# Patient Record
Sex: Female | Born: 1945 | Race: White | Hispanic: No
Health system: Southern US, Community
[De-identification: ages and names within clinical notes are randomized; demographics above are authoritative.]

---

## 2002-03-28 ENCOUNTER — Other Ambulatory Visit: Admission: RE | Admit: 2002-03-28 | Discharge: 2002-03-28 | Payer: Self-pay

## 2002-04-12 ENCOUNTER — Encounter: Admission: RE | Admit: 2002-04-12 | Discharge: 2002-04-12 | Payer: Self-pay

## 2004-07-17 ENCOUNTER — Encounter: Admission: RE | Admit: 2004-07-17 | Discharge: 2004-07-17 | Payer: Self-pay | Admitting: Obstetrics and Gynecology

## 2004-07-17 ENCOUNTER — Other Ambulatory Visit: Admission: RE | Admit: 2004-07-17 | Discharge: 2004-07-17 | Payer: Self-pay | Admitting: Obstetrics and Gynecology

## 2005-06-18 ENCOUNTER — Encounter: Admission: RE | Admit: 2005-06-18 | Discharge: 2005-06-18 | Payer: Self-pay | Admitting: Family Medicine

## 2007-07-03 ENCOUNTER — Other Ambulatory Visit: Admission: RE | Admit: 2007-07-03 | Discharge: 2007-07-03 | Payer: Self-pay | Admitting: Family Medicine

## 2009-05-26 ENCOUNTER — Encounter: Admission: RE | Admit: 2009-05-26 | Discharge: 2009-05-26 | Payer: Self-pay | Admitting: Obstetrics and Gynecology

## 2009-05-26 ENCOUNTER — Other Ambulatory Visit: Admission: RE | Admit: 2009-05-26 | Discharge: 2009-05-26 | Payer: Self-pay | Admitting: Obstetrics and Gynecology

## 2010-06-02 ENCOUNTER — Encounter: Admission: RE | Admit: 2010-06-02 | Discharge: 2010-06-02 | Payer: Self-pay | Admitting: Obstetrics and Gynecology

## 2010-06-03 ENCOUNTER — Other Ambulatory Visit: Admission: RE | Admit: 2010-06-03 | Discharge: 2010-06-03 | Payer: Self-pay | Admitting: Obstetrics and Gynecology

## 2011-05-17 ENCOUNTER — Other Ambulatory Visit: Payer: Self-pay | Admitting: Obstetrics and Gynecology

## 2011-05-17 DIAGNOSIS — Z1231 Encounter for screening mammogram for malignant neoplasm of breast: Secondary | ICD-10-CM

## 2011-06-02 ENCOUNTER — Other Ambulatory Visit: Payer: Self-pay | Admitting: Obstetrics and Gynecology

## 2011-06-02 ENCOUNTER — Other Ambulatory Visit (HOSPITAL_COMMUNITY)
Admission: RE | Admit: 2011-06-02 | Discharge: 2011-06-02 | Disposition: A | Discharge status: Home / Self Care | Payer: BC Managed Care – PPO | Source: Ambulatory Visit | Attending: Obstetrics and Gynecology | Admitting: Obstetrics and Gynecology

## 2011-06-02 DIAGNOSIS — Z01419 Encounter for gynecological examination (general) (routine) without abnormal findings: Secondary | ICD-10-CM | POA: Insufficient documentation

## 2011-06-04 ENCOUNTER — Ambulatory Visit
Admission: RE | Admit: 2011-06-04 | Discharge: 2011-06-04 | Disposition: A | Discharge status: Home / Self Care | Payer: BC Managed Care – PPO | Source: Ambulatory Visit | Attending: Obstetrics and Gynecology | Admitting: Obstetrics and Gynecology

## 2011-06-04 DIAGNOSIS — Z1231 Encounter for screening mammogram for malignant neoplasm of breast: Secondary | ICD-10-CM

## 2012-05-24 ENCOUNTER — Other Ambulatory Visit: Payer: Self-pay | Admitting: Obstetrics and Gynecology

## 2012-05-24 DIAGNOSIS — Z1231 Encounter for screening mammogram for malignant neoplasm of breast: Secondary | ICD-10-CM

## 2012-06-01 ENCOUNTER — Other Ambulatory Visit (HOSPITAL_COMMUNITY)
Admission: RE | Admit: 2012-06-01 | Discharge: 2012-06-01 | Disposition: A | Discharge status: Home / Self Care | Payer: Medicare Other | Source: Ambulatory Visit | Attending: Obstetrics and Gynecology | Admitting: Obstetrics and Gynecology

## 2012-06-01 ENCOUNTER — Other Ambulatory Visit: Payer: Self-pay | Admitting: Obstetrics and Gynecology

## 2012-06-01 DIAGNOSIS — Z124 Encounter for screening for malignant neoplasm of cervix: Secondary | ICD-10-CM | POA: Diagnosis not present

## 2012-06-01 DIAGNOSIS — Z1159 Encounter for screening for other viral diseases: Secondary | ICD-10-CM | POA: Insufficient documentation

## 2012-06-01 DIAGNOSIS — Z01419 Encounter for gynecological examination (general) (routine) without abnormal findings: Secondary | ICD-10-CM | POA: Diagnosis not present

## 2012-06-01 DIAGNOSIS — N952 Postmenopausal atrophic vaginitis: Secondary | ICD-10-CM | POA: Diagnosis not present

## 2012-06-05 ENCOUNTER — Ambulatory Visit
Admission: RE | Admit: 2012-06-05 | Discharge: 2012-06-05 | Disposition: A | Discharge status: Home / Self Care | Payer: Medicare Other | Source: Ambulatory Visit | Attending: Obstetrics and Gynecology | Admitting: Obstetrics and Gynecology

## 2012-06-05 DIAGNOSIS — Z1231 Encounter for screening mammogram for malignant neoplasm of breast: Secondary | ICD-10-CM

## 2012-06-21 DIAGNOSIS — H2510 Age-related nuclear cataract, unspecified eye: Secondary | ICD-10-CM | POA: Diagnosis not present

## 2012-07-26 DIAGNOSIS — R059 Cough, unspecified: Secondary | ICD-10-CM | POA: Diagnosis not present

## 2012-07-26 DIAGNOSIS — I10 Essential (primary) hypertension: Secondary | ICD-10-CM | POA: Diagnosis not present

## 2012-07-26 DIAGNOSIS — R509 Fever, unspecified: Secondary | ICD-10-CM | POA: Diagnosis not present

## 2012-07-26 DIAGNOSIS — J029 Acute pharyngitis, unspecified: Secondary | ICD-10-CM | POA: Diagnosis not present

## 2012-07-26 DIAGNOSIS — R05 Cough: Secondary | ICD-10-CM | POA: Diagnosis not present

## 2012-10-06 DIAGNOSIS — I10 Essential (primary) hypertension: Secondary | ICD-10-CM | POA: Diagnosis not present

## 2012-10-06 DIAGNOSIS — R82998 Other abnormal findings in urine: Secondary | ICD-10-CM | POA: Diagnosis not present

## 2012-10-06 DIAGNOSIS — E785 Hyperlipidemia, unspecified: Secondary | ICD-10-CM | POA: Diagnosis not present

## 2012-10-09 DIAGNOSIS — Z0000 Encounter for general adult medical examination without abnormal findings: Secondary | ICD-10-CM | POA: Diagnosis not present

## 2012-10-09 DIAGNOSIS — E785 Hyperlipidemia, unspecified: Secondary | ICD-10-CM | POA: Diagnosis not present

## 2012-10-09 DIAGNOSIS — M25469 Effusion, unspecified knee: Secondary | ICD-10-CM | POA: Diagnosis not present

## 2012-10-09 DIAGNOSIS — I10 Essential (primary) hypertension: Secondary | ICD-10-CM | POA: Diagnosis not present

## 2012-10-12 DIAGNOSIS — Z1212 Encounter for screening for malignant neoplasm of rectum: Secondary | ICD-10-CM | POA: Diagnosis not present

## 2013-05-30 ENCOUNTER — Other Ambulatory Visit: Payer: Self-pay

## 2013-05-30 DIAGNOSIS — Z1231 Encounter for screening mammogram for malignant neoplasm of breast: Secondary | ICD-10-CM

## 2013-06-04 DIAGNOSIS — N952 Postmenopausal atrophic vaginitis: Secondary | ICD-10-CM | POA: Diagnosis not present

## 2013-06-05 DIAGNOSIS — H2510 Age-related nuclear cataract, unspecified eye: Secondary | ICD-10-CM | POA: Diagnosis not present

## 2013-06-12 DIAGNOSIS — H6980 Other specified disorders of Eustachian tube, unspecified ear: Secondary | ICD-10-CM | POA: Diagnosis not present

## 2013-06-12 DIAGNOSIS — I10 Essential (primary) hypertension: Secondary | ICD-10-CM | POA: Diagnosis not present

## 2013-06-14 ENCOUNTER — Ambulatory Visit
Admission: RE | Admit: 2013-06-14 | Discharge: 2013-06-14 | Disposition: A | Discharge status: Home / Self Care | Payer: Medicare Other | Source: Ambulatory Visit

## 2013-06-14 DIAGNOSIS — Z1231 Encounter for screening mammogram for malignant neoplasm of breast: Secondary | ICD-10-CM | POA: Diagnosis not present

## 2013-07-02 DIAGNOSIS — H6980 Other specified disorders of Eustachian tube, unspecified ear: Secondary | ICD-10-CM | POA: Diagnosis not present

## 2013-07-02 DIAGNOSIS — J342 Deviated nasal septum: Secondary | ICD-10-CM | POA: Diagnosis not present

## 2013-10-01 DIAGNOSIS — H6980 Other specified disorders of Eustachian tube, unspecified ear: Secondary | ICD-10-CM | POA: Diagnosis not present

## 2013-10-11 DIAGNOSIS — I10 Essential (primary) hypertension: Secondary | ICD-10-CM | POA: Diagnosis not present

## 2013-10-11 DIAGNOSIS — R82998 Other abnormal findings in urine: Secondary | ICD-10-CM | POA: Diagnosis not present

## 2013-10-11 DIAGNOSIS — E785 Hyperlipidemia, unspecified: Secondary | ICD-10-CM | POA: Diagnosis not present

## 2013-10-18 DIAGNOSIS — Z6827 Body mass index (BMI) 27.0-27.9, adult: Secondary | ICD-10-CM | POA: Diagnosis not present

## 2013-10-18 DIAGNOSIS — E785 Hyperlipidemia, unspecified: Secondary | ICD-10-CM | POA: Diagnosis not present

## 2013-10-18 DIAGNOSIS — Z0000 Encounter for general adult medical examination without abnormal findings: Secondary | ICD-10-CM | POA: Diagnosis not present

## 2013-10-18 DIAGNOSIS — Z23 Encounter for immunization: Secondary | ICD-10-CM | POA: Diagnosis not present

## 2013-10-18 DIAGNOSIS — R3129 Other microscopic hematuria: Secondary | ICD-10-CM | POA: Diagnosis not present

## 2013-10-18 DIAGNOSIS — I10 Essential (primary) hypertension: Secondary | ICD-10-CM | POA: Diagnosis not present

## 2013-10-18 DIAGNOSIS — Z79899 Other long term (current) drug therapy: Secondary | ICD-10-CM | POA: Diagnosis not present

## 2013-10-19 DIAGNOSIS — Z1212 Encounter for screening for malignant neoplasm of rectum: Secondary | ICD-10-CM | POA: Diagnosis not present

## 2013-10-25 DIAGNOSIS — Q6101 Congenital single renal cyst: Secondary | ICD-10-CM | POA: Diagnosis not present

## 2013-10-25 DIAGNOSIS — N200 Calculus of kidney: Secondary | ICD-10-CM | POA: Diagnosis not present

## 2013-10-25 DIAGNOSIS — R3129 Other microscopic hematuria: Secondary | ICD-10-CM | POA: Diagnosis not present

## 2013-10-30 DIAGNOSIS — N200 Calculus of kidney: Secondary | ICD-10-CM | POA: Diagnosis not present

## 2013-10-30 DIAGNOSIS — N281 Cyst of kidney, acquired: Secondary | ICD-10-CM | POA: Diagnosis not present

## 2013-11-19 DIAGNOSIS — R3129 Other microscopic hematuria: Secondary | ICD-10-CM | POA: Diagnosis not present

## 2013-11-19 DIAGNOSIS — N289 Disorder of kidney and ureter, unspecified: Secondary | ICD-10-CM | POA: Diagnosis not present

## 2014-04-30 ENCOUNTER — Other Ambulatory Visit (HOSPITAL_COMMUNITY): Payer: Medicare Other

## 2014-04-30 ENCOUNTER — Other Ambulatory Visit: Payer: Self-pay | Admitting: Urology

## 2014-04-30 DIAGNOSIS — N2889 Other specified disorders of kidney and ureter: Secondary | ICD-10-CM

## 2014-05-09 ENCOUNTER — Ambulatory Visit (HOSPITAL_COMMUNITY)
Admission: RE | Admit: 2014-05-09 | Discharge: 2014-05-09 | Disposition: A | Discharge status: Home / Self Care | Payer: Medicare Other | Source: Ambulatory Visit | Attending: Urology | Admitting: Urology

## 2014-05-09 DIAGNOSIS — K7689 Other specified diseases of liver: Secondary | ICD-10-CM | POA: Diagnosis not present

## 2014-05-09 DIAGNOSIS — N281 Cyst of kidney, acquired: Secondary | ICD-10-CM | POA: Diagnosis not present

## 2014-05-09 DIAGNOSIS — N289 Disorder of kidney and ureter, unspecified: Secondary | ICD-10-CM | POA: Insufficient documentation

## 2014-05-09 DIAGNOSIS — N2889 Other specified disorders of kidney and ureter: Secondary | ICD-10-CM

## 2014-05-09 LAB — POCT I-STAT CREATININE: Creatinine, Ser: 1 mg/dL (ref 0.50–1.10)

## 2014-05-09 MED ORDER — GADOBENATE DIMEGLUMINE 529 MG/ML IV SOLN
15.0000 mL | Freq: Once | INTRAVENOUS | Status: AC | PRN
Start: 2014-05-09 — End: 2014-05-09
  Administered 2014-05-09: 13 mL via INTRAVENOUS

## 2014-05-13 ENCOUNTER — Other Ambulatory Visit: Payer: Self-pay

## 2014-05-13 DIAGNOSIS — Z1231 Encounter for screening mammogram for malignant neoplasm of breast: Secondary | ICD-10-CM

## 2014-05-15 DIAGNOSIS — N289 Disorder of kidney and ureter, unspecified: Secondary | ICD-10-CM | POA: Diagnosis not present

## 2014-05-15 DIAGNOSIS — R3129 Other microscopic hematuria: Secondary | ICD-10-CM | POA: Diagnosis not present

## 2014-05-15 DIAGNOSIS — N200 Calculus of kidney: Secondary | ICD-10-CM | POA: Diagnosis not present

## 2014-06-05 ENCOUNTER — Other Ambulatory Visit (HOSPITAL_COMMUNITY)
Admission: RE | Admit: 2014-06-05 | Discharge: 2014-06-05 | Disposition: A | Discharge status: Home / Self Care | Payer: Medicare Other | Source: Ambulatory Visit | Attending: Obstetrics and Gynecology | Admitting: Obstetrics and Gynecology

## 2014-06-05 ENCOUNTER — Other Ambulatory Visit: Payer: Self-pay | Admitting: Obstetrics and Gynecology

## 2014-06-05 DIAGNOSIS — Z01419 Encounter for gynecological examination (general) (routine) without abnormal findings: Secondary | ICD-10-CM | POA: Diagnosis not present

## 2014-06-10 LAB — CYTOLOGY - PAP

## 2014-06-17 ENCOUNTER — Ambulatory Visit
Admission: RE | Admit: 2014-06-17 | Discharge: 2014-06-17 | Disposition: A | Discharge status: Home / Self Care | Payer: Medicare Other | Source: Ambulatory Visit

## 2014-06-17 DIAGNOSIS — Z1231 Encounter for screening mammogram for malignant neoplasm of breast: Secondary | ICD-10-CM

## 2014-07-15 DIAGNOSIS — H43399 Other vitreous opacities, unspecified eye: Secondary | ICD-10-CM | POA: Diagnosis not present

## 2014-07-15 DIAGNOSIS — H2510 Age-related nuclear cataract, unspecified eye: Secondary | ICD-10-CM | POA: Diagnosis not present

## 2014-11-04 DIAGNOSIS — Z0000 Encounter for general adult medical examination without abnormal findings: Secondary | ICD-10-CM | POA: Diagnosis not present

## 2014-11-04 DIAGNOSIS — I10 Essential (primary) hypertension: Secondary | ICD-10-CM | POA: Diagnosis not present

## 2014-11-04 DIAGNOSIS — E785 Hyperlipidemia, unspecified: Secondary | ICD-10-CM | POA: Diagnosis not present

## 2014-11-12 DIAGNOSIS — Z1389 Encounter for screening for other disorder: Secondary | ICD-10-CM | POA: Diagnosis not present

## 2014-11-12 DIAGNOSIS — I10 Essential (primary) hypertension: Secondary | ICD-10-CM | POA: Diagnosis not present

## 2014-11-12 DIAGNOSIS — E785 Hyperlipidemia, unspecified: Secondary | ICD-10-CM | POA: Diagnosis not present

## 2014-11-12 DIAGNOSIS — R312 Other microscopic hematuria: Secondary | ICD-10-CM | POA: Diagnosis not present

## 2014-11-12 DIAGNOSIS — Z0000 Encounter for general adult medical examination without abnormal findings: Secondary | ICD-10-CM | POA: Diagnosis not present

## 2014-11-12 DIAGNOSIS — Z6826 Body mass index (BMI) 26.0-26.9, adult: Secondary | ICD-10-CM | POA: Diagnosis not present

## 2014-11-13 DIAGNOSIS — Z1212 Encounter for screening for malignant neoplasm of rectum: Secondary | ICD-10-CM | POA: Diagnosis not present

## 2015-05-12 ENCOUNTER — Other Ambulatory Visit: Payer: Self-pay

## 2015-05-12 DIAGNOSIS — Z1231 Encounter for screening mammogram for malignant neoplasm of breast: Secondary | ICD-10-CM

## 2015-06-20 ENCOUNTER — Ambulatory Visit
Admission: RE | Admit: 2015-06-20 | Discharge: 2015-06-20 | Disposition: A | Discharge status: Home / Self Care | Payer: Medicare Other | Source: Ambulatory Visit

## 2015-06-20 DIAGNOSIS — Z1231 Encounter for screening mammogram for malignant neoplasm of breast: Secondary | ICD-10-CM

## 2015-07-29 DIAGNOSIS — H25813 Combined forms of age-related cataract, bilateral: Secondary | ICD-10-CM | POA: Diagnosis not present

## 2015-11-10 DIAGNOSIS — R8299 Other abnormal findings in urine: Secondary | ICD-10-CM | POA: Diagnosis not present

## 2015-11-10 DIAGNOSIS — I10 Essential (primary) hypertension: Secondary | ICD-10-CM | POA: Diagnosis not present

## 2015-11-10 DIAGNOSIS — E784 Other hyperlipidemia: Secondary | ICD-10-CM | POA: Diagnosis not present

## 2015-11-17 DIAGNOSIS — M19049 Primary osteoarthritis, unspecified hand: Secondary | ICD-10-CM | POA: Diagnosis not present

## 2015-11-17 DIAGNOSIS — Z1389 Encounter for screening for other disorder: Secondary | ICD-10-CM | POA: Diagnosis not present

## 2015-11-17 DIAGNOSIS — R3129 Other microscopic hematuria: Secondary | ICD-10-CM | POA: Diagnosis not present

## 2015-11-17 DIAGNOSIS — Z6827 Body mass index (BMI) 27.0-27.9, adult: Secondary | ICD-10-CM | POA: Diagnosis not present

## 2015-11-17 DIAGNOSIS — Z23 Encounter for immunization: Secondary | ICD-10-CM | POA: Diagnosis not present

## 2015-11-17 DIAGNOSIS — Z0000 Encounter for general adult medical examination without abnormal findings: Secondary | ICD-10-CM | POA: Diagnosis not present

## 2015-11-17 DIAGNOSIS — E784 Other hyperlipidemia: Secondary | ICD-10-CM | POA: Diagnosis not present

## 2015-11-17 DIAGNOSIS — I10 Essential (primary) hypertension: Secondary | ICD-10-CM | POA: Diagnosis not present

## 2015-11-18 DIAGNOSIS — Z1212 Encounter for screening for malignant neoplasm of rectum: Secondary | ICD-10-CM | POA: Diagnosis not present

## 2016-06-14 ENCOUNTER — Other Ambulatory Visit: Payer: Self-pay | Admitting: Internal Medicine

## 2016-06-14 DIAGNOSIS — Z1231 Encounter for screening mammogram for malignant neoplasm of breast: Secondary | ICD-10-CM

## 2016-06-21 ENCOUNTER — Ambulatory Visit
Admission: RE | Admit: 2016-06-21 | Discharge: 2016-06-21 | Disposition: A | Discharge status: Home / Self Care | Payer: Medicare Other | Source: Ambulatory Visit | Attending: Internal Medicine | Admitting: Internal Medicine

## 2016-06-21 DIAGNOSIS — Z1231 Encounter for screening mammogram for malignant neoplasm of breast: Secondary | ICD-10-CM | POA: Diagnosis not present

## 2016-07-01 ENCOUNTER — Other Ambulatory Visit (HOSPITAL_COMMUNITY)
Admission: RE | Admit: 2016-07-01 | Discharge: 2016-07-01 | Disposition: A | Discharge status: Home / Self Care | Payer: Medicare Other | Source: Ambulatory Visit | Attending: Obstetrics and Gynecology | Admitting: Obstetrics and Gynecology

## 2016-07-01 ENCOUNTER — Other Ambulatory Visit: Payer: Self-pay | Admitting: Obstetrics and Gynecology

## 2016-07-01 DIAGNOSIS — M8580 Other specified disorders of bone density and structure, unspecified site: Secondary | ICD-10-CM | POA: Diagnosis not present

## 2016-07-01 DIAGNOSIS — Z01419 Encounter for gynecological examination (general) (routine) without abnormal findings: Secondary | ICD-10-CM | POA: Diagnosis not present

## 2016-07-01 DIAGNOSIS — Z1151 Encounter for screening for human papillomavirus (HPV): Secondary | ICD-10-CM | POA: Diagnosis not present

## 2016-07-02 LAB — CYTOLOGY - PAP

## 2016-07-29 DIAGNOSIS — M8588 Other specified disorders of bone density and structure, other site: Secondary | ICD-10-CM | POA: Diagnosis not present

## 2016-08-03 DIAGNOSIS — H04123 Dry eye syndrome of bilateral lacrimal glands: Secondary | ICD-10-CM | POA: Diagnosis not present

## 2016-08-03 DIAGNOSIS — H02833 Dermatochalasis of right eye, unspecified eyelid: Secondary | ICD-10-CM | POA: Diagnosis not present

## 2016-08-03 DIAGNOSIS — H25813 Combined forms of age-related cataract, bilateral: Secondary | ICD-10-CM | POA: Diagnosis not present

## 2016-08-03 DIAGNOSIS — H527 Unspecified disorder of refraction: Secondary | ICD-10-CM | POA: Diagnosis not present

## 2016-12-17 DIAGNOSIS — I10 Essential (primary) hypertension: Secondary | ICD-10-CM | POA: Diagnosis not present

## 2016-12-17 DIAGNOSIS — E784 Other hyperlipidemia: Secondary | ICD-10-CM | POA: Diagnosis not present

## 2016-12-24 DIAGNOSIS — Z0000 Encounter for general adult medical examination without abnormal findings: Secondary | ICD-10-CM | POA: Diagnosis not present

## 2016-12-24 DIAGNOSIS — Z1389 Encounter for screening for other disorder: Secondary | ICD-10-CM | POA: Diagnosis not present

## 2016-12-24 DIAGNOSIS — Z6828 Body mass index (BMI) 28.0-28.9, adult: Secondary | ICD-10-CM | POA: Diagnosis not present

## 2016-12-24 DIAGNOSIS — I10 Essential (primary) hypertension: Secondary | ICD-10-CM | POA: Diagnosis not present

## 2016-12-24 DIAGNOSIS — R3121 Asymptomatic microscopic hematuria: Secondary | ICD-10-CM | POA: Diagnosis not present

## 2016-12-24 DIAGNOSIS — M19049 Primary osteoarthritis, unspecified hand: Secondary | ICD-10-CM | POA: Diagnosis not present

## 2016-12-24 DIAGNOSIS — E784 Other hyperlipidemia: Secondary | ICD-10-CM | POA: Diagnosis not present

## 2017-01-20 DIAGNOSIS — Z1212 Encounter for screening for malignant neoplasm of rectum: Secondary | ICD-10-CM | POA: Diagnosis not present

## 2017-03-08 DIAGNOSIS — S61012A Laceration without foreign body of left thumb without damage to nail, initial encounter: Secondary | ICD-10-CM | POA: Diagnosis not present

## 2017-03-18 DIAGNOSIS — Z4802 Encounter for removal of sutures: Secondary | ICD-10-CM | POA: Diagnosis not present

## 2017-05-19 ENCOUNTER — Other Ambulatory Visit: Payer: Self-pay | Admitting: Obstetrics and Gynecology

## 2017-05-19 DIAGNOSIS — Z1231 Encounter for screening mammogram for malignant neoplasm of breast: Secondary | ICD-10-CM

## 2017-06-22 ENCOUNTER — Ambulatory Visit
Admission: RE | Admit: 2017-06-22 | Discharge: 2017-06-22 | Disposition: A | Discharge status: Home / Self Care | Payer: Medicare Other | Source: Ambulatory Visit | Attending: Obstetrics and Gynecology | Admitting: Obstetrics and Gynecology

## 2017-06-22 DIAGNOSIS — Z1231 Encounter for screening mammogram for malignant neoplasm of breast: Secondary | ICD-10-CM

## 2017-08-04 DIAGNOSIS — H25813 Combined forms of age-related cataract, bilateral: Secondary | ICD-10-CM | POA: Diagnosis not present

## 2017-08-04 DIAGNOSIS — H02833 Dermatochalasis of right eye, unspecified eyelid: Secondary | ICD-10-CM | POA: Diagnosis not present

## 2017-08-04 DIAGNOSIS — H0289 Other specified disorders of eyelid: Secondary | ICD-10-CM | POA: Diagnosis not present

## 2018-01-23 DIAGNOSIS — R82998 Other abnormal findings in urine: Secondary | ICD-10-CM | POA: Diagnosis not present

## 2018-01-23 DIAGNOSIS — E7849 Other hyperlipidemia: Secondary | ICD-10-CM | POA: Diagnosis not present

## 2018-01-23 DIAGNOSIS — I10 Essential (primary) hypertension: Secondary | ICD-10-CM | POA: Diagnosis not present

## 2018-01-26 DIAGNOSIS — N200 Calculus of kidney: Secondary | ICD-10-CM | POA: Diagnosis not present

## 2018-01-26 DIAGNOSIS — M19049 Primary osteoarthritis, unspecified hand: Secondary | ICD-10-CM | POA: Diagnosis not present

## 2018-01-26 DIAGNOSIS — I10 Essential (primary) hypertension: Secondary | ICD-10-CM | POA: Diagnosis not present

## 2018-01-26 DIAGNOSIS — Z6828 Body mass index (BMI) 28.0-28.9, adult: Secondary | ICD-10-CM | POA: Diagnosis not present

## 2018-01-26 DIAGNOSIS — Z0000 Encounter for general adult medical examination without abnormal findings: Secondary | ICD-10-CM | POA: Diagnosis not present

## 2018-01-26 DIAGNOSIS — Z1389 Encounter for screening for other disorder: Secondary | ICD-10-CM | POA: Diagnosis not present

## 2018-01-26 DIAGNOSIS — E785 Hyperlipidemia, unspecified: Secondary | ICD-10-CM | POA: Diagnosis not present

## 2018-02-02 DIAGNOSIS — Z1212 Encounter for screening for malignant neoplasm of rectum: Secondary | ICD-10-CM | POA: Diagnosis not present

## 2018-05-15 ENCOUNTER — Other Ambulatory Visit: Payer: Self-pay | Admitting: Obstetrics and Gynecology

## 2018-05-15 DIAGNOSIS — Z1231 Encounter for screening mammogram for malignant neoplasm of breast: Secondary | ICD-10-CM

## 2018-06-26 ENCOUNTER — Ambulatory Visit: Payer: Medicare Other

## 2018-07-03 DIAGNOSIS — Z01419 Encounter for gynecological examination (general) (routine) without abnormal findings: Secondary | ICD-10-CM | POA: Diagnosis not present

## 2018-07-03 DIAGNOSIS — N362 Urethral caruncle: Secondary | ICD-10-CM | POA: Diagnosis not present

## 2018-07-03 DIAGNOSIS — M8588 Other specified disorders of bone density and structure, other site: Secondary | ICD-10-CM | POA: Diagnosis not present

## 2018-07-13 ENCOUNTER — Ambulatory Visit
Admission: RE | Admit: 2018-07-13 | Discharge: 2018-07-13 | Disposition: A | Discharge status: Home / Self Care | Payer: Medicare Other | Source: Ambulatory Visit | Attending: Obstetrics and Gynecology | Admitting: Obstetrics and Gynecology

## 2018-07-13 DIAGNOSIS — Z1231 Encounter for screening mammogram for malignant neoplasm of breast: Secondary | ICD-10-CM

## 2018-08-14 DIAGNOSIS — H25813 Combined forms of age-related cataract, bilateral: Secondary | ICD-10-CM | POA: Diagnosis not present

## 2019-01-22 DIAGNOSIS — I10 Essential (primary) hypertension: Secondary | ICD-10-CM | POA: Diagnosis not present

## 2019-01-22 DIAGNOSIS — R82998 Other abnormal findings in urine: Secondary | ICD-10-CM | POA: Diagnosis not present

## 2019-01-22 DIAGNOSIS — E7849 Other hyperlipidemia: Secondary | ICD-10-CM | POA: Diagnosis not present

## 2019-01-29 DIAGNOSIS — Z0000 Encounter for general adult medical examination without abnormal findings: Secondary | ICD-10-CM | POA: Diagnosis not present

## 2019-01-29 DIAGNOSIS — M19049 Primary osteoarthritis, unspecified hand: Secondary | ICD-10-CM | POA: Diagnosis not present

## 2019-01-29 DIAGNOSIS — Z1339 Encounter for screening examination for other mental health and behavioral disorders: Secondary | ICD-10-CM | POA: Diagnosis not present

## 2019-01-29 DIAGNOSIS — E7849 Other hyperlipidemia: Secondary | ICD-10-CM | POA: Diagnosis not present

## 2019-01-29 DIAGNOSIS — I10 Essential (primary) hypertension: Secondary | ICD-10-CM | POA: Diagnosis not present

## 2019-01-29 DIAGNOSIS — Z1331 Encounter for screening for depression: Secondary | ICD-10-CM | POA: Diagnosis not present

## 2019-01-30 DIAGNOSIS — Z1212 Encounter for screening for malignant neoplasm of rectum: Secondary | ICD-10-CM | POA: Diagnosis not present

## 2019-07-26 ENCOUNTER — Other Ambulatory Visit: Payer: Self-pay | Admitting: Obstetrics and Gynecology

## 2019-07-26 DIAGNOSIS — Z1231 Encounter for screening mammogram for malignant neoplasm of breast: Secondary | ICD-10-CM

## 2019-08-31 IMAGING — MG DIGITAL SCREENING BILATERAL MAMMOGRAM WITH CAD
5 series · 5 of 5 positions shown · non-contrast
Comparison: Previous exam(s).

CLINICAL DATA: Screening.

EXAM:
DIGITAL SCREENING BILATERAL MAMMOGRAM WITH CAD

[R CC (1 of 2)]
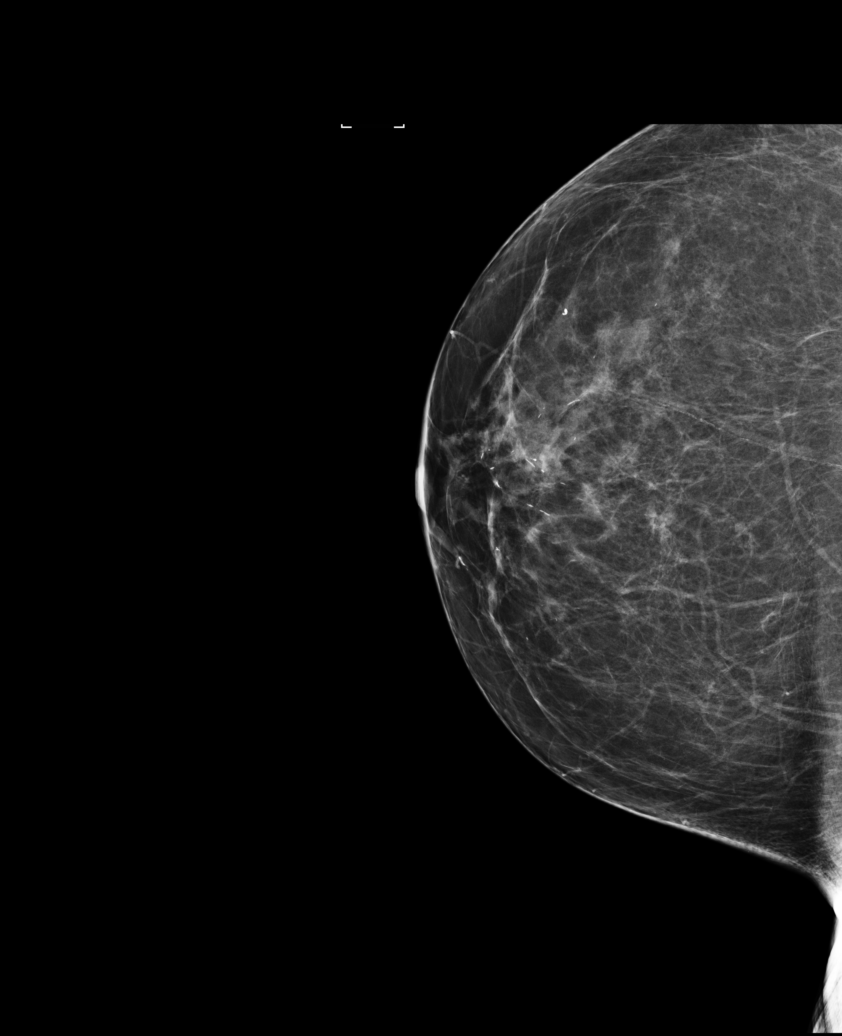

[R MLO]
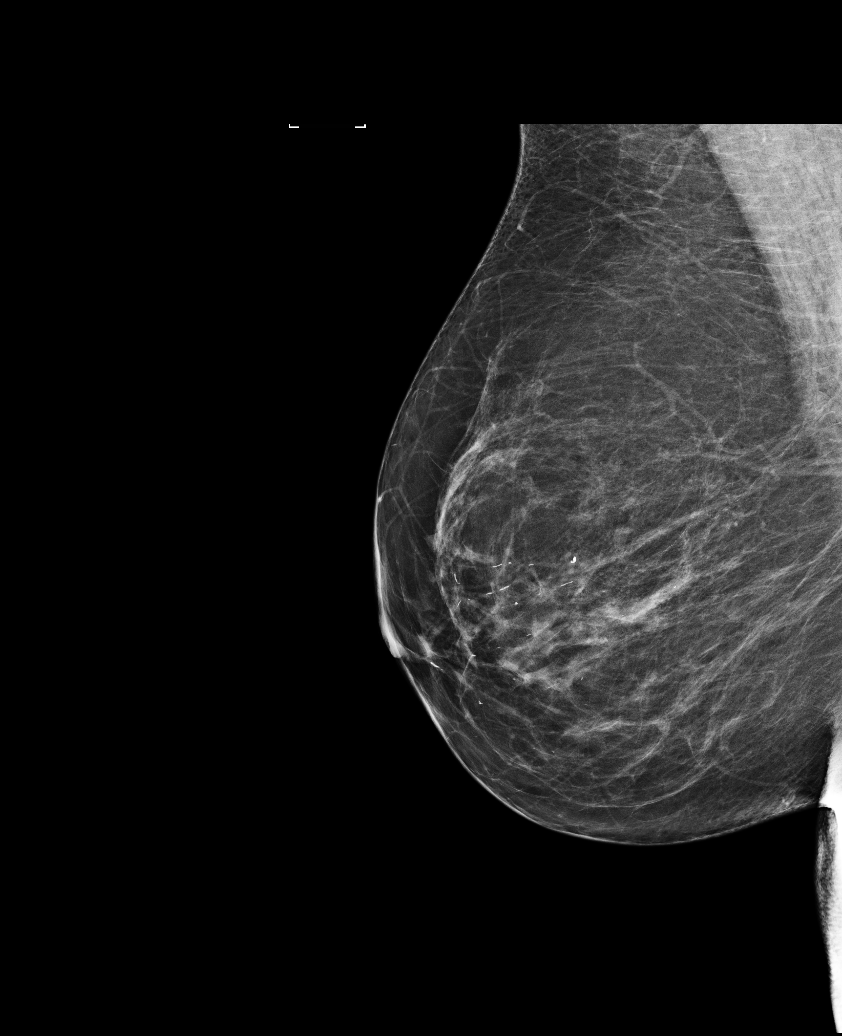

[L MLO]
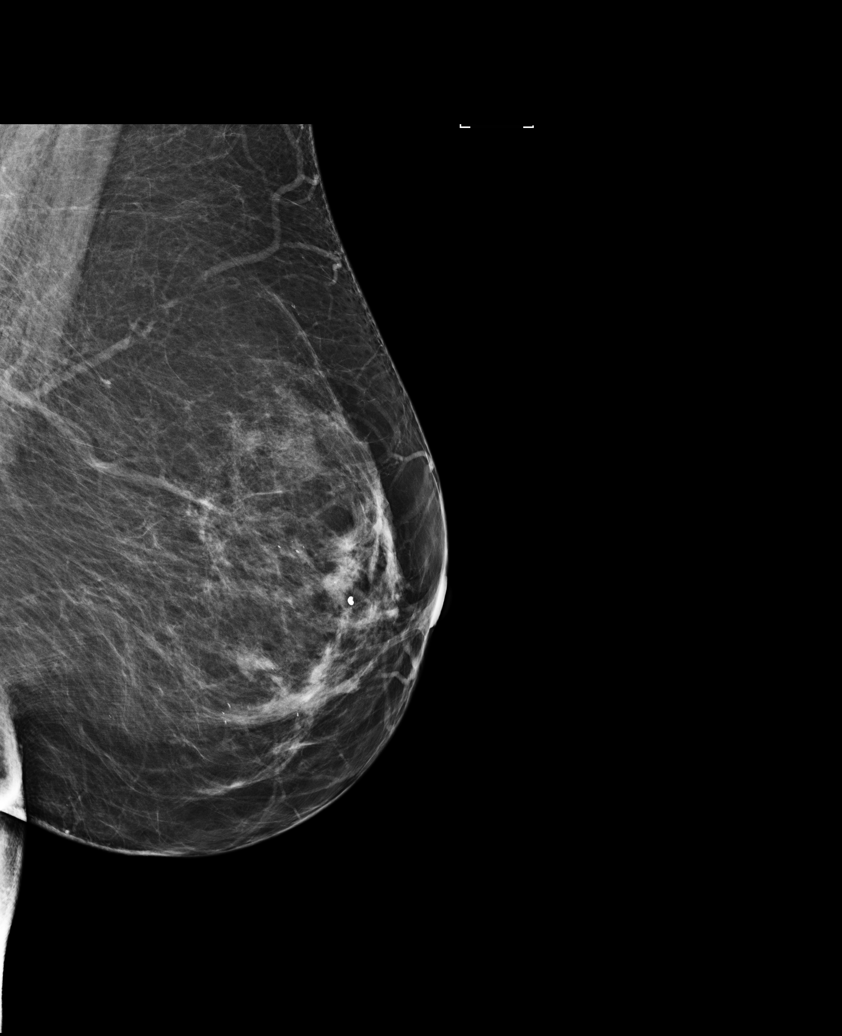

[L CC]
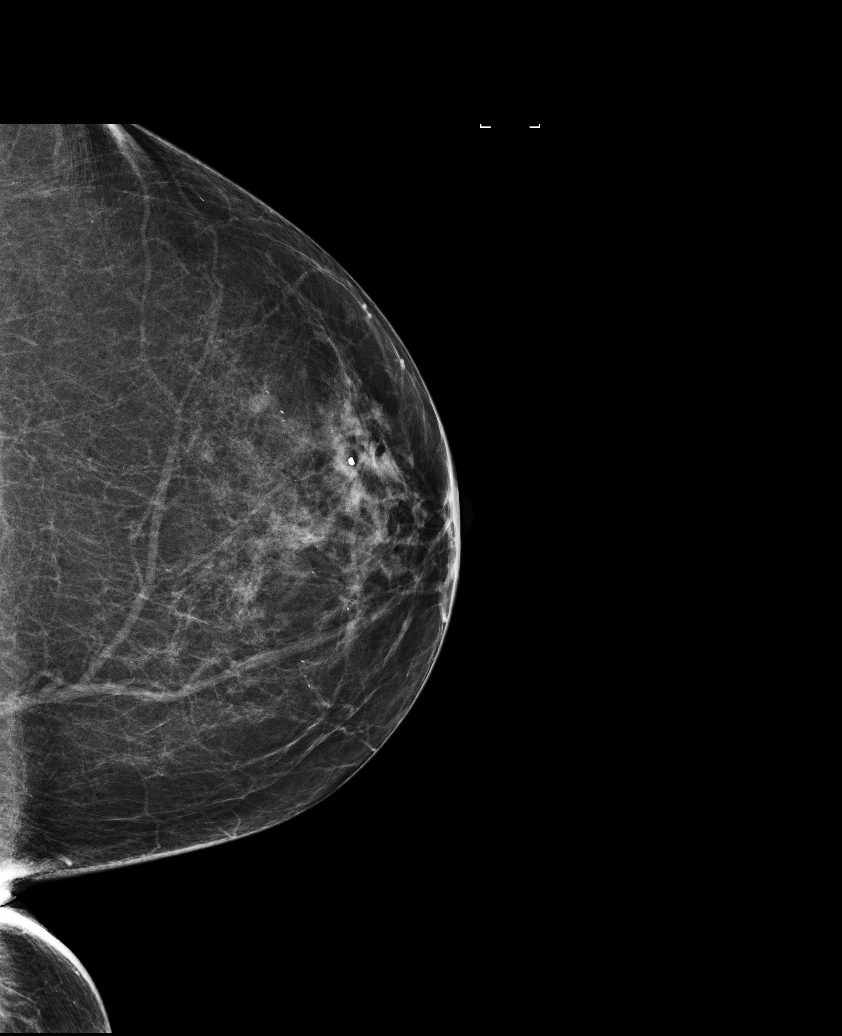

[R CC (2 of 2)]
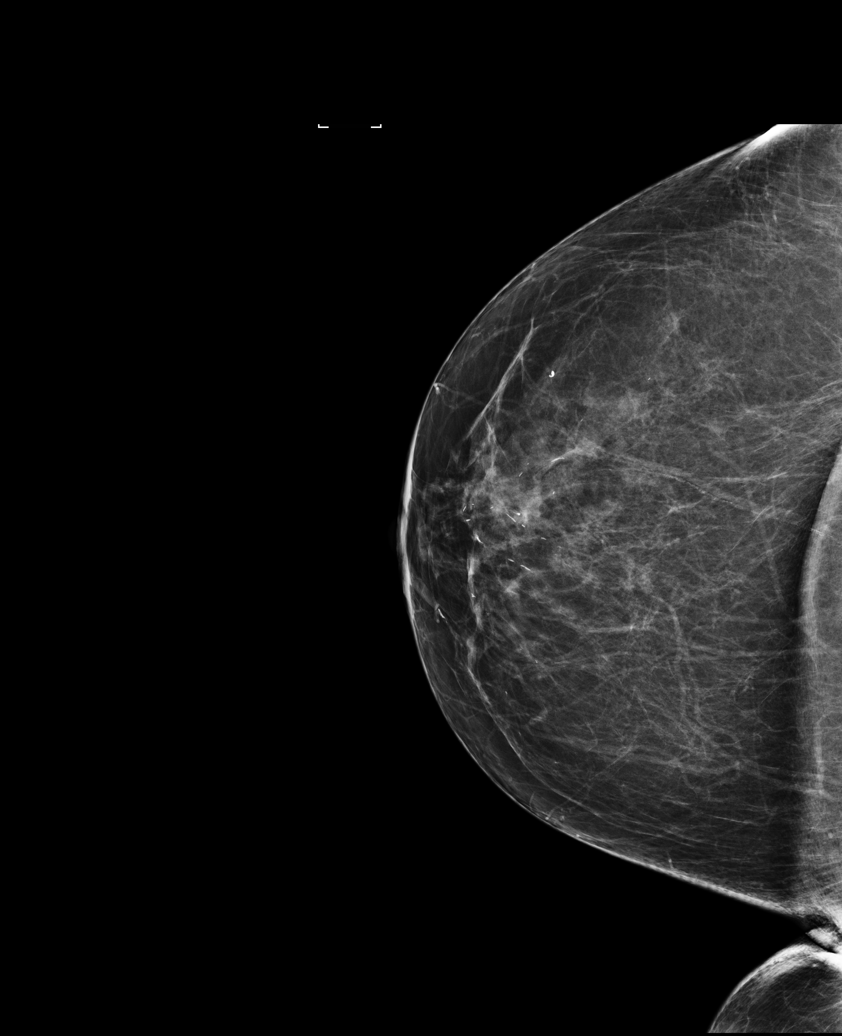

[5 of 5 positions shown; findings below may reference images not displayed]

ACR Breast Density Category b: There are scattered areas of
fibroglandular density.
FINDINGS: There are no findings suspicious for malignancy. Images were
processed with CAD.
IMPRESSION: No mammographic evidence of malignancy. A result letter of this
screening mammogram will be mailed directly to the patient.

RECOMMENDATION:
Screening mammogram in one year. (Code:AS-G-LCT)

BI-RADS CATEGORY  1: Negative.

## 2019-09-07 ENCOUNTER — Ambulatory Visit
Admission: RE | Admit: 2019-09-07 | Discharge: 2019-09-07 | Disposition: A | Discharge status: Home / Self Care | Payer: Medicare HMO | Source: Ambulatory Visit | Attending: Obstetrics and Gynecology | Admitting: Obstetrics and Gynecology

## 2019-09-07 ENCOUNTER — Other Ambulatory Visit: Payer: Self-pay

## 2019-09-07 DIAGNOSIS — Z1231 Encounter for screening mammogram for malignant neoplasm of breast: Secondary | ICD-10-CM | POA: Diagnosis not present

## 2020-01-06 ENCOUNTER — Ambulatory Visit: Payer: Self-pay

## 2020-01-12 ENCOUNTER — Ambulatory Visit: Payer: Medicare HMO

## 2020-01-17 ENCOUNTER — Ambulatory Visit: Payer: Self-pay

## 2020-01-28 DIAGNOSIS — Z0000 Encounter for general adult medical examination without abnormal findings: Secondary | ICD-10-CM | POA: Diagnosis not present

## 2020-01-28 DIAGNOSIS — E7849 Other hyperlipidemia: Secondary | ICD-10-CM | POA: Diagnosis not present

## 2020-01-29 DIAGNOSIS — R69 Illness, unspecified: Secondary | ICD-10-CM | POA: Diagnosis not present

## 2020-02-04 DIAGNOSIS — R82998 Other abnormal findings in urine: Secondary | ICD-10-CM | POA: Diagnosis not present

## 2020-02-04 DIAGNOSIS — I10 Essential (primary) hypertension: Secondary | ICD-10-CM | POA: Diagnosis not present

## 2020-02-04 DIAGNOSIS — N200 Calculus of kidney: Secondary | ICD-10-CM | POA: Diagnosis not present

## 2020-02-04 DIAGNOSIS — E785 Hyperlipidemia, unspecified: Secondary | ICD-10-CM | POA: Diagnosis not present

## 2020-02-04 DIAGNOSIS — R635 Abnormal weight gain: Secondary | ICD-10-CM | POA: Diagnosis not present

## 2020-02-04 DIAGNOSIS — Z1339 Encounter for screening examination for other mental health and behavioral disorders: Secondary | ICD-10-CM | POA: Diagnosis not present

## 2020-02-04 DIAGNOSIS — Z1331 Encounter for screening for depression: Secondary | ICD-10-CM | POA: Diagnosis not present

## 2020-02-04 DIAGNOSIS — Z0000 Encounter for general adult medical examination without abnormal findings: Secondary | ICD-10-CM | POA: Diagnosis not present

## 2020-02-04 DIAGNOSIS — M19049 Primary osteoarthritis, unspecified hand: Secondary | ICD-10-CM | POA: Diagnosis not present

## 2020-02-29 DIAGNOSIS — Z1212 Encounter for screening for malignant neoplasm of rectum: Secondary | ICD-10-CM | POA: Diagnosis not present

## 2020-07-14 DIAGNOSIS — H35363 Drusen (degenerative) of macula, bilateral: Secondary | ICD-10-CM | POA: Diagnosis not present

## 2020-07-14 DIAGNOSIS — H25813 Combined forms of age-related cataract, bilateral: Secondary | ICD-10-CM | POA: Diagnosis not present

## 2020-07-15 DIAGNOSIS — N362 Urethral caruncle: Secondary | ICD-10-CM | POA: Diagnosis not present

## 2020-07-15 DIAGNOSIS — Z01419 Encounter for gynecological examination (general) (routine) without abnormal findings: Secondary | ICD-10-CM | POA: Diagnosis not present

## 2020-07-15 DIAGNOSIS — M8580 Other specified disorders of bone density and structure, unspecified site: Secondary | ICD-10-CM | POA: Diagnosis not present

## 2020-07-18 ENCOUNTER — Other Ambulatory Visit: Payer: Self-pay | Admitting: Obstetrics and Gynecology

## 2020-07-18 DIAGNOSIS — M8580 Other specified disorders of bone density and structure, unspecified site: Secondary | ICD-10-CM

## 2020-08-12 DIAGNOSIS — R69 Illness, unspecified: Secondary | ICD-10-CM | POA: Diagnosis not present

## 2020-08-19 ENCOUNTER — Other Ambulatory Visit: Payer: Self-pay | Admitting: Obstetrics and Gynecology

## 2020-08-19 DIAGNOSIS — Z1231 Encounter for screening mammogram for malignant neoplasm of breast: Secondary | ICD-10-CM

## 2020-08-26 DIAGNOSIS — R69 Illness, unspecified: Secondary | ICD-10-CM | POA: Diagnosis not present

## 2020-09-11 DIAGNOSIS — R69 Illness, unspecified: Secondary | ICD-10-CM | POA: Diagnosis not present

## 2020-09-18 DIAGNOSIS — H353132 Nonexudative age-related macular degeneration, bilateral, intermediate dry stage: Secondary | ICD-10-CM | POA: Diagnosis not present

## 2020-09-18 DIAGNOSIS — H43813 Vitreous degeneration, bilateral: Secondary | ICD-10-CM | POA: Diagnosis not present

## 2020-09-18 DIAGNOSIS — H43823 Vitreomacular adhesion, bilateral: Secondary | ICD-10-CM | POA: Diagnosis not present

## 2020-09-18 DIAGNOSIS — H35033 Hypertensive retinopathy, bilateral: Secondary | ICD-10-CM | POA: Diagnosis not present

## 2020-09-22 DIAGNOSIS — Z0100 Encounter for examination of eyes and vision without abnormal findings: Secondary | ICD-10-CM | POA: Diagnosis not present

## 2020-09-30 ENCOUNTER — Ambulatory Visit
Admission: RE | Admit: 2020-09-30 | Discharge: 2020-09-30 | Disposition: A | Discharge status: Home / Self Care | Payer: Medicare HMO | Source: Ambulatory Visit | Attending: Obstetrics and Gynecology | Admitting: Obstetrics and Gynecology

## 2020-09-30 ENCOUNTER — Other Ambulatory Visit: Payer: Self-pay

## 2020-09-30 ENCOUNTER — Ambulatory Visit: Payer: Medicare HMO

## 2020-09-30 DIAGNOSIS — Z1231 Encounter for screening mammogram for malignant neoplasm of breast: Secondary | ICD-10-CM | POA: Diagnosis not present

## 2020-10-25 IMAGING — MG MM DIGITAL SCREENING BILAT W/ CAD
4 series · 4 of 4 positions shown · non-contrast
Comparison: Previous exam(s).

CLINICAL DATA: Screening.

EXAM:
DIGITAL SCREENING BILATERAL MAMMOGRAM WITH CAD

[L CC]
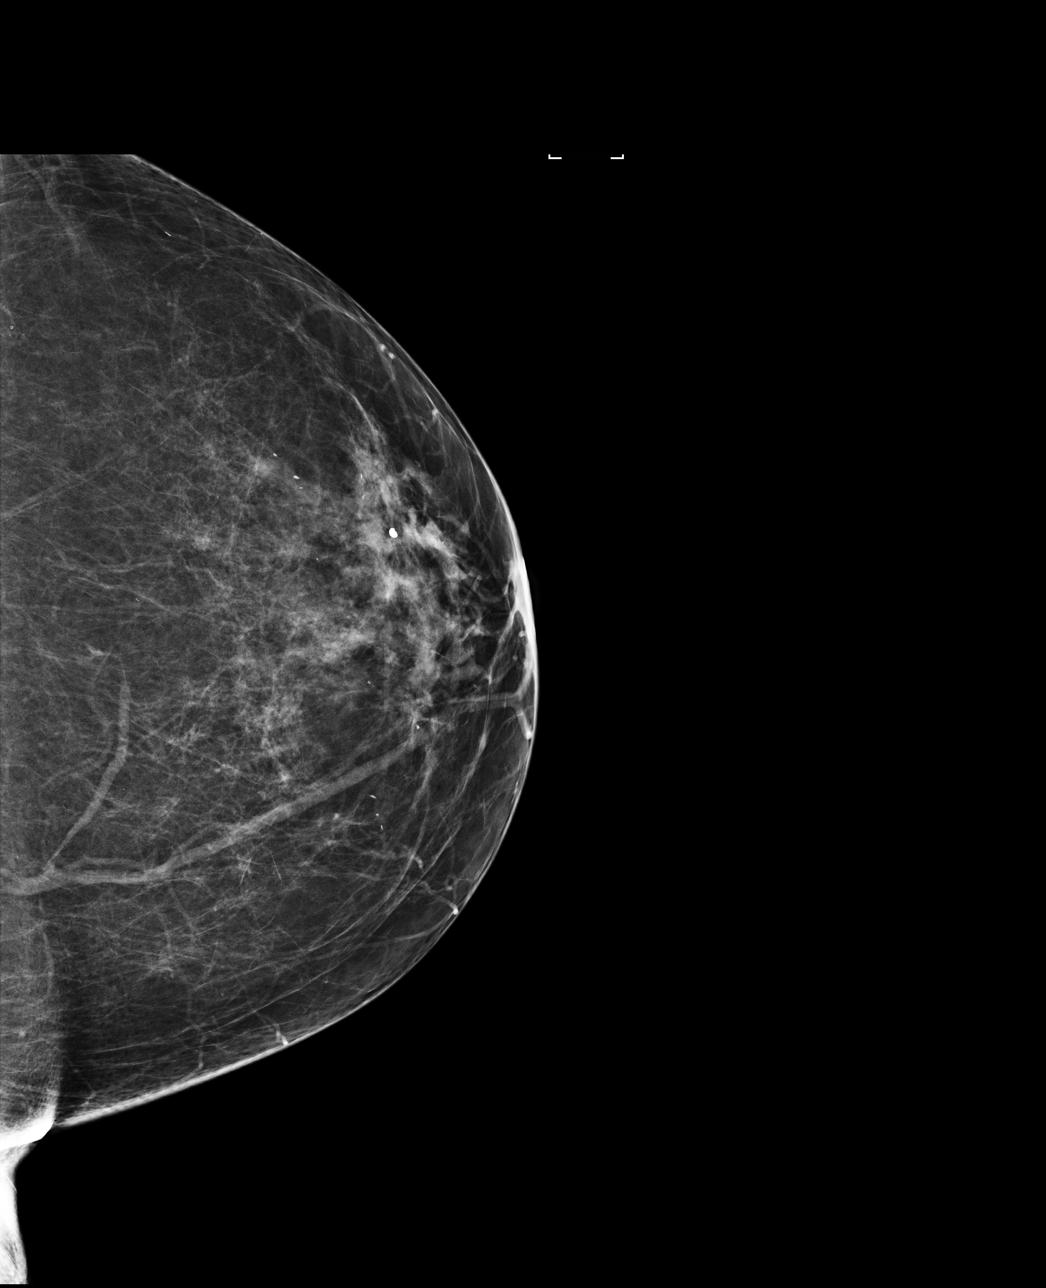

[L MLO]
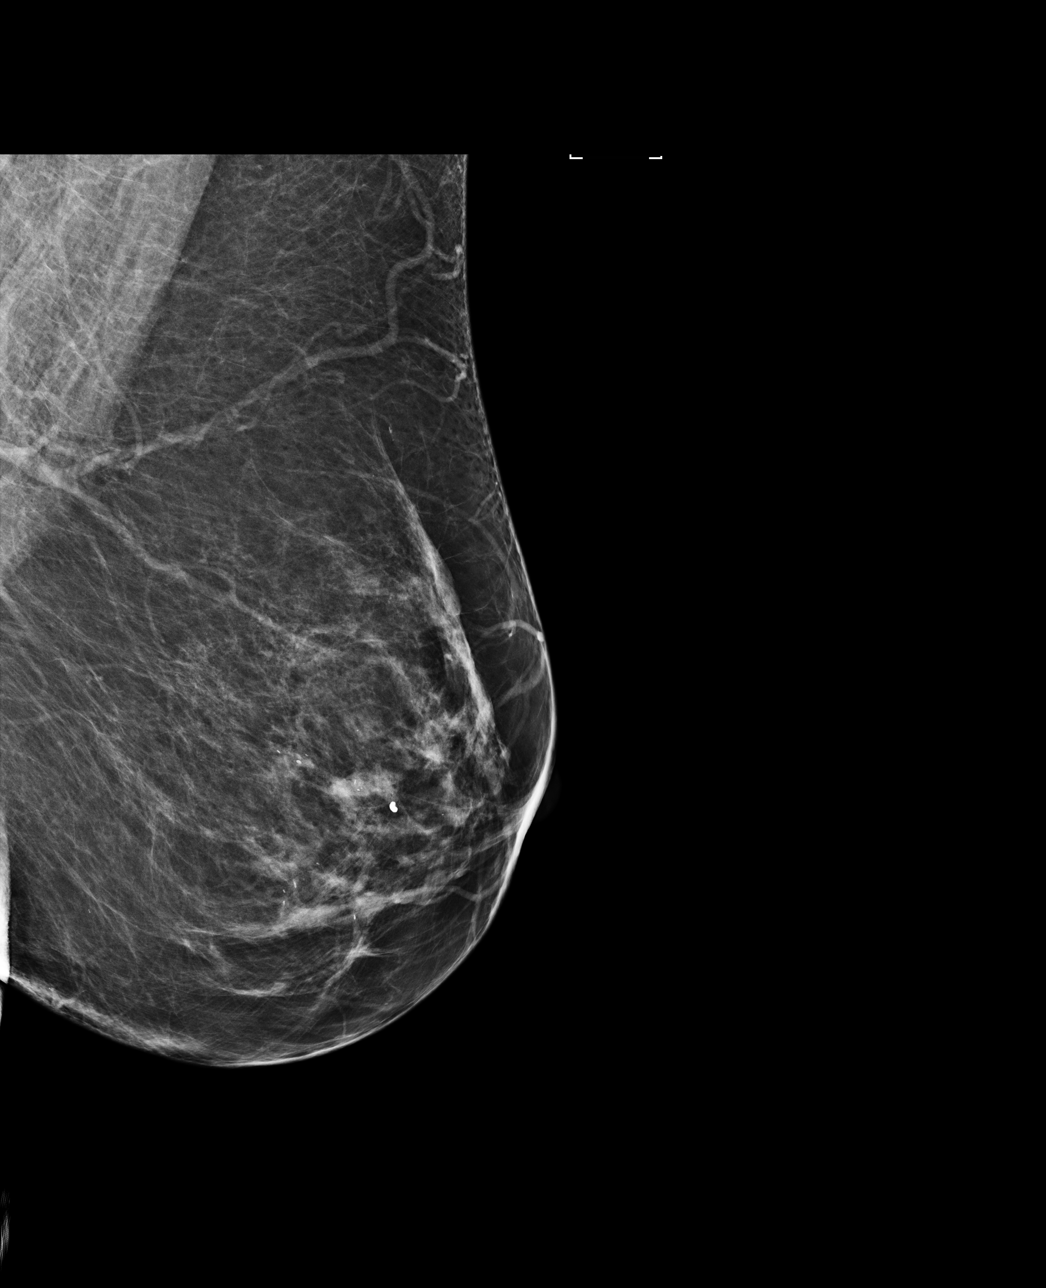

[R CC]
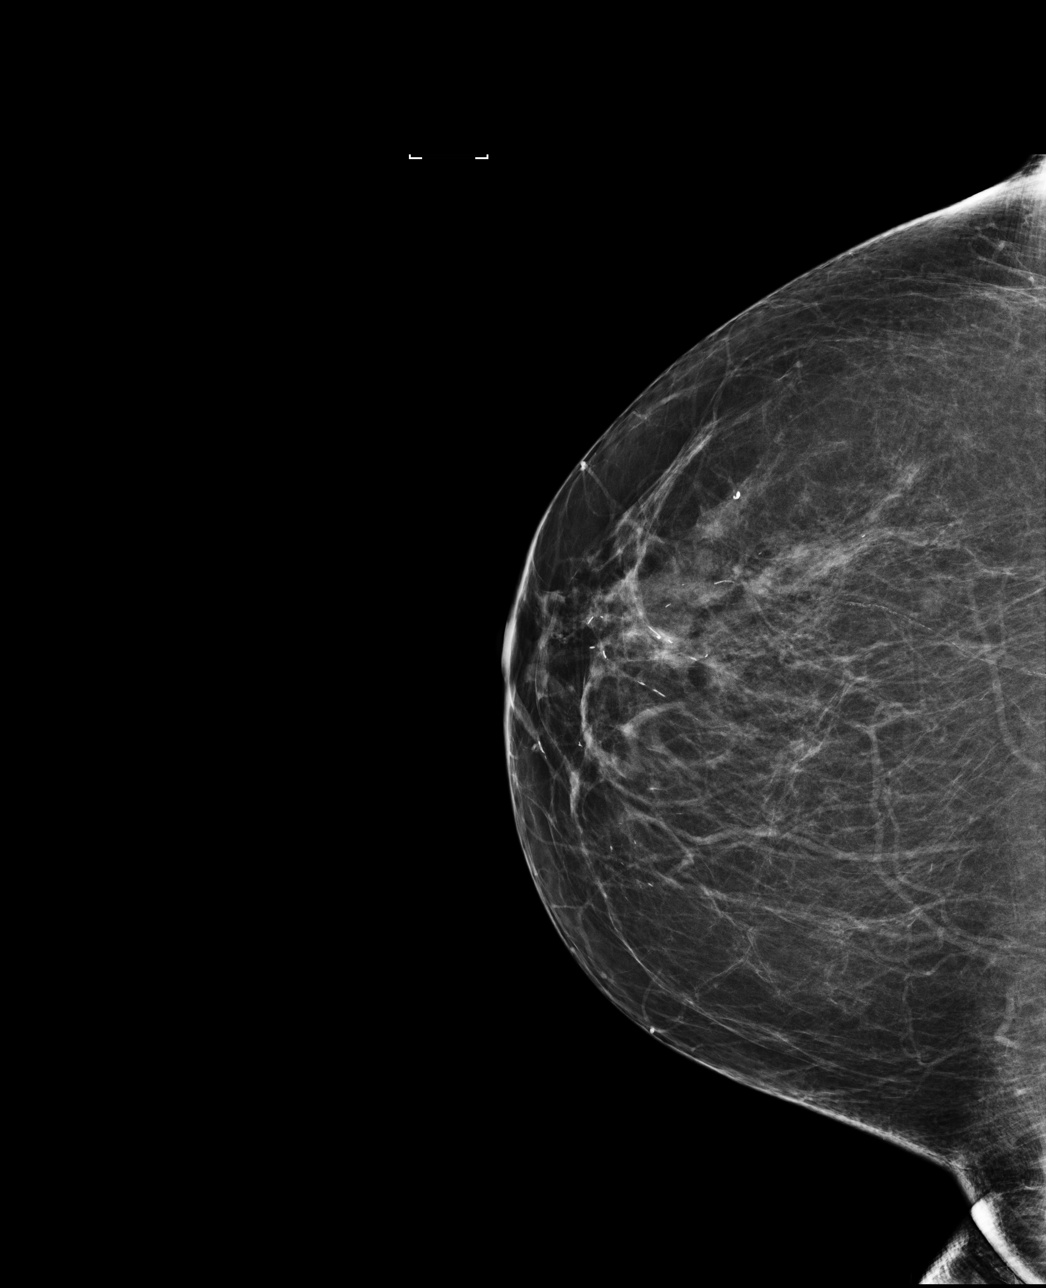

[R MLO]
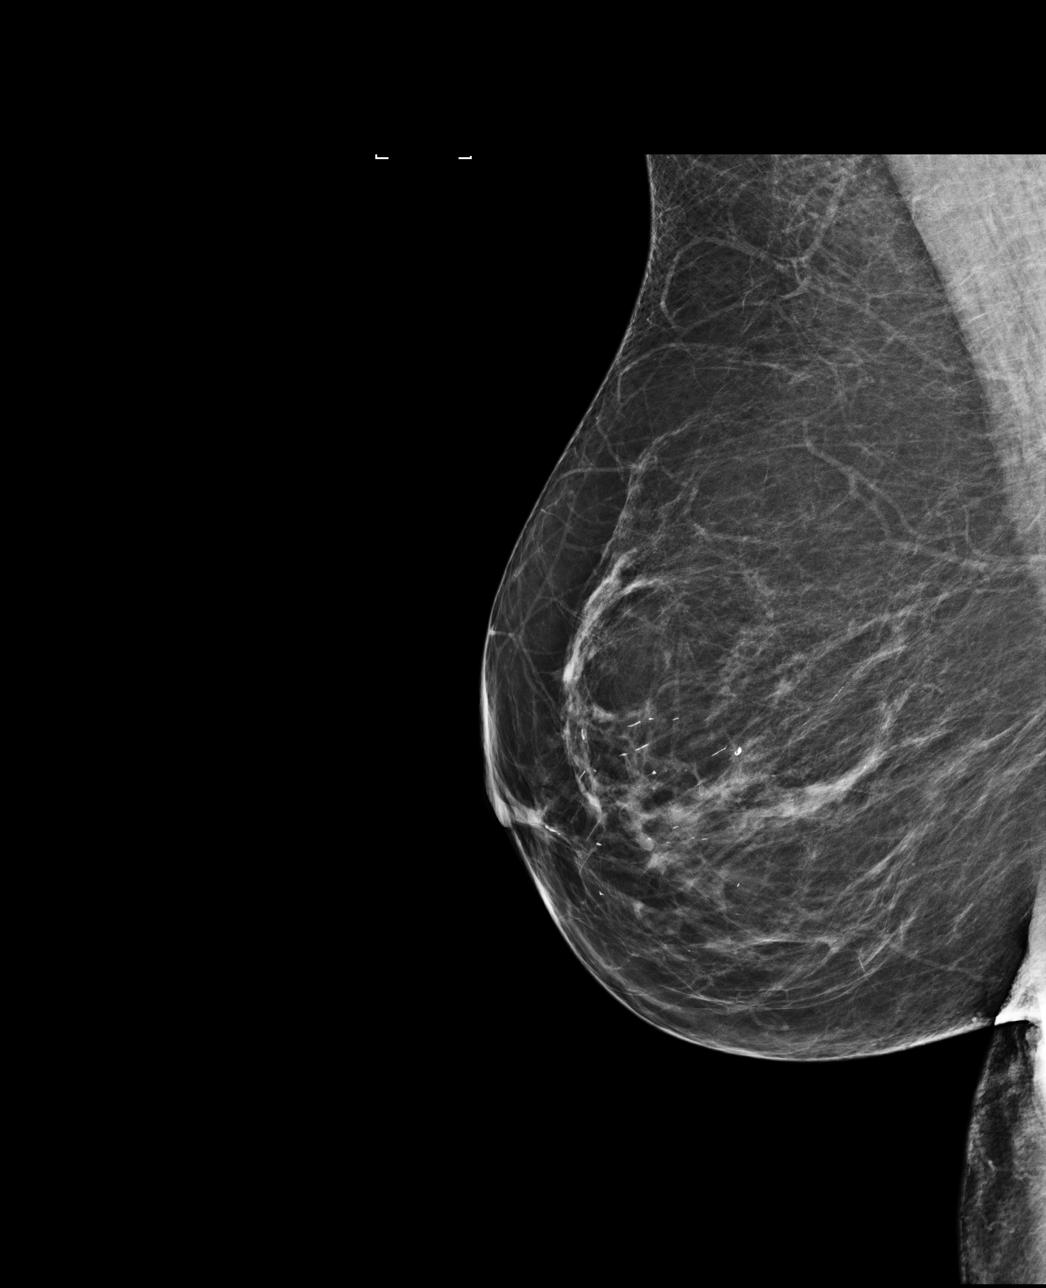

[4 of 4 positions shown; findings below may reference images not displayed]

ACR Breast Density Category b: There are scattered areas of
fibroglandular density.
FINDINGS: There are no findings suspicious for malignancy. Images were
processed with CAD.
IMPRESSION: No mammographic evidence of malignancy. A result letter of this
screening mammogram will be mailed directly to the patient.

RECOMMENDATION:
Screening mammogram in one year. (Code:AS-G-LCT)

BI-RADS CATEGORY  1: Negative.

## 2020-12-10 ENCOUNTER — Other Ambulatory Visit: Payer: Self-pay

## 2020-12-10 ENCOUNTER — Ambulatory Visit
Admission: RE | Admit: 2020-12-10 | Discharge: 2020-12-10 | Disposition: A | Discharge status: Home / Self Care | Payer: Medicare HMO | Source: Ambulatory Visit | Attending: Obstetrics and Gynecology | Admitting: Obstetrics and Gynecology

## 2020-12-10 DIAGNOSIS — Z780 Asymptomatic menopausal state: Secondary | ICD-10-CM | POA: Diagnosis not present

## 2020-12-10 DIAGNOSIS — M8589 Other specified disorders of bone density and structure, multiple sites: Secondary | ICD-10-CM | POA: Diagnosis not present

## 2020-12-10 DIAGNOSIS — M8580 Other specified disorders of bone density and structure, unspecified site: Secondary | ICD-10-CM

## 2020-12-19 DIAGNOSIS — H2513 Age-related nuclear cataract, bilateral: Secondary | ICD-10-CM | POA: Diagnosis not present

## 2020-12-19 DIAGNOSIS — H25013 Cortical age-related cataract, bilateral: Secondary | ICD-10-CM | POA: Diagnosis not present

## 2020-12-19 DIAGNOSIS — H524 Presbyopia: Secondary | ICD-10-CM | POA: Diagnosis not present

## 2020-12-19 DIAGNOSIS — H35363 Drusen (degenerative) of macula, bilateral: Secondary | ICD-10-CM | POA: Diagnosis not present

## 2021-02-24 DIAGNOSIS — E785 Hyperlipidemia, unspecified: Secondary | ICD-10-CM | POA: Diagnosis not present

## 2021-03-03 DIAGNOSIS — M791 Myalgia, unspecified site: Secondary | ICD-10-CM | POA: Diagnosis not present

## 2021-03-03 DIAGNOSIS — Z1331 Encounter for screening for depression: Secondary | ICD-10-CM | POA: Diagnosis not present

## 2021-03-03 DIAGNOSIS — Z1339 Encounter for screening examination for other mental health and behavioral disorders: Secondary | ICD-10-CM | POA: Diagnosis not present

## 2021-03-03 DIAGNOSIS — Z0000 Encounter for general adult medical examination without abnormal findings: Secondary | ICD-10-CM | POA: Diagnosis not present

## 2021-03-03 DIAGNOSIS — E785 Hyperlipidemia, unspecified: Secondary | ICD-10-CM | POA: Diagnosis not present

## 2021-03-03 DIAGNOSIS — T466X5A Adverse effect of antihyperlipidemic and antiarteriosclerotic drugs, initial encounter: Secondary | ICD-10-CM | POA: Diagnosis not present

## 2021-03-03 DIAGNOSIS — M19049 Primary osteoarthritis, unspecified hand: Secondary | ICD-10-CM | POA: Diagnosis not present

## 2021-03-03 DIAGNOSIS — R82998 Other abnormal findings in urine: Secondary | ICD-10-CM | POA: Diagnosis not present

## 2021-03-03 DIAGNOSIS — I10 Essential (primary) hypertension: Secondary | ICD-10-CM | POA: Diagnosis not present

## 2021-07-07 DIAGNOSIS — E785 Hyperlipidemia, unspecified: Secondary | ICD-10-CM | POA: Diagnosis not present

## 2021-08-25 ENCOUNTER — Other Ambulatory Visit: Payer: Self-pay | Admitting: Obstetrics and Gynecology

## 2021-08-25 DIAGNOSIS — Z1231 Encounter for screening mammogram for malignant neoplasm of breast: Secondary | ICD-10-CM

## 2021-10-06 ENCOUNTER — Other Ambulatory Visit: Payer: Self-pay

## 2021-10-06 ENCOUNTER — Ambulatory Visit
Admission: RE | Admit: 2021-10-06 | Discharge: 2021-10-06 | Disposition: A | Discharge status: Home / Self Care | Payer: Medicare HMO | Source: Ambulatory Visit | Attending: Obstetrics and Gynecology | Admitting: Obstetrics and Gynecology

## 2021-10-06 DIAGNOSIS — Z1231 Encounter for screening mammogram for malignant neoplasm of breast: Secondary | ICD-10-CM | POA: Diagnosis not present

## 2021-10-15 DIAGNOSIS — K5900 Constipation, unspecified: Secondary | ICD-10-CM | POA: Diagnosis not present

## 2021-10-15 DIAGNOSIS — R1030 Lower abdominal pain, unspecified: Secondary | ICD-10-CM | POA: Diagnosis not present

## 2021-12-24 DIAGNOSIS — H2513 Age-related nuclear cataract, bilateral: Secondary | ICD-10-CM | POA: Diagnosis not present

## 2021-12-24 DIAGNOSIS — H524 Presbyopia: Secondary | ICD-10-CM | POA: Diagnosis not present

## 2021-12-24 DIAGNOSIS — H25013 Cortical age-related cataract, bilateral: Secondary | ICD-10-CM | POA: Diagnosis not present

## 2021-12-24 DIAGNOSIS — H35363 Drusen (degenerative) of macula, bilateral: Secondary | ICD-10-CM | POA: Diagnosis not present

## 2022-01-20 DIAGNOSIS — Z1211 Encounter for screening for malignant neoplasm of colon: Secondary | ICD-10-CM | POA: Diagnosis not present

## 2022-01-20 DIAGNOSIS — K5730 Diverticulosis of large intestine without perforation or abscess without bleeding: Secondary | ICD-10-CM | POA: Diagnosis not present

## 2022-04-13 DIAGNOSIS — I10 Essential (primary) hypertension: Secondary | ICD-10-CM | POA: Diagnosis not present

## 2022-04-13 DIAGNOSIS — E785 Hyperlipidemia, unspecified: Secondary | ICD-10-CM | POA: Diagnosis not present

## 2022-04-13 DIAGNOSIS — R7989 Other specified abnormal findings of blood chemistry: Secondary | ICD-10-CM | POA: Diagnosis not present

## 2022-04-20 DIAGNOSIS — E785 Hyperlipidemia, unspecified: Secondary | ICD-10-CM | POA: Diagnosis not present

## 2022-04-20 DIAGNOSIS — Z0000 Encounter for general adult medical examination without abnormal findings: Secondary | ICD-10-CM | POA: Diagnosis not present

## 2022-04-20 DIAGNOSIS — E7800 Pure hypercholesterolemia, unspecified: Secondary | ICD-10-CM | POA: Diagnosis not present

## 2022-04-20 DIAGNOSIS — R82998 Other abnormal findings in urine: Secondary | ICD-10-CM | POA: Diagnosis not present

## 2022-04-20 DIAGNOSIS — I10 Essential (primary) hypertension: Secondary | ICD-10-CM | POA: Diagnosis not present

## 2022-04-20 DIAGNOSIS — M791 Myalgia, unspecified site: Secondary | ICD-10-CM | POA: Diagnosis not present

## 2022-07-19 DIAGNOSIS — Z01419 Encounter for gynecological examination (general) (routine) without abnormal findings: Secondary | ICD-10-CM | POA: Diagnosis not present

## 2022-08-04 DIAGNOSIS — H25013 Cortical age-related cataract, bilateral: Secondary | ICD-10-CM | POA: Diagnosis not present

## 2022-08-04 DIAGNOSIS — H2513 Age-related nuclear cataract, bilateral: Secondary | ICD-10-CM | POA: Diagnosis not present

## 2022-09-01 ENCOUNTER — Other Ambulatory Visit: Payer: Self-pay | Admitting: Obstetrics and Gynecology

## 2022-09-01 ENCOUNTER — Other Ambulatory Visit: Payer: Self-pay | Admitting: Internal Medicine

## 2022-09-01 DIAGNOSIS — Z1231 Encounter for screening mammogram for malignant neoplasm of breast: Secondary | ICD-10-CM

## 2022-09-28 DIAGNOSIS — H2512 Age-related nuclear cataract, left eye: Secondary | ICD-10-CM | POA: Diagnosis not present

## 2022-09-28 DIAGNOSIS — H25812 Combined forms of age-related cataract, left eye: Secondary | ICD-10-CM | POA: Diagnosis not present

## 2022-09-28 DIAGNOSIS — H269 Unspecified cataract: Secondary | ICD-10-CM | POA: Diagnosis not present

## 2022-09-28 DIAGNOSIS — H25012 Cortical age-related cataract, left eye: Secondary | ICD-10-CM | POA: Diagnosis not present

## 2022-09-28 DIAGNOSIS — Z961 Presence of intraocular lens: Secondary | ICD-10-CM | POA: Diagnosis not present

## 2022-10-07 ENCOUNTER — Ambulatory Visit: Payer: Medicare HMO

## 2022-10-12 DIAGNOSIS — Z961 Presence of intraocular lens: Secondary | ICD-10-CM | POA: Diagnosis not present

## 2022-10-12 DIAGNOSIS — H2511 Age-related nuclear cataract, right eye: Secondary | ICD-10-CM | POA: Diagnosis not present

## 2022-10-12 DIAGNOSIS — H25811 Combined forms of age-related cataract, right eye: Secondary | ICD-10-CM | POA: Diagnosis not present

## 2022-10-12 DIAGNOSIS — H25011 Cortical age-related cataract, right eye: Secondary | ICD-10-CM | POA: Diagnosis not present

## 2022-10-12 DIAGNOSIS — H52201 Unspecified astigmatism, right eye: Secondary | ICD-10-CM | POA: Diagnosis not present

## 2022-10-12 DIAGNOSIS — H269 Unspecified cataract: Secondary | ICD-10-CM | POA: Diagnosis not present

## 2022-11-26 ENCOUNTER — Ambulatory Visit
Admission: RE | Admit: 2022-11-26 | Discharge: 2022-11-26 | Disposition: A | Discharge status: Home / Self Care | Payer: Medicare HMO | Source: Ambulatory Visit | Attending: Internal Medicine | Admitting: Internal Medicine

## 2022-11-26 DIAGNOSIS — Z1231 Encounter for screening mammogram for malignant neoplasm of breast: Secondary | ICD-10-CM

## 2023-06-27 DIAGNOSIS — E785 Hyperlipidemia, unspecified: Secondary | ICD-10-CM | POA: Diagnosis not present

## 2023-06-27 DIAGNOSIS — I10 Essential (primary) hypertension: Secondary | ICD-10-CM | POA: Diagnosis not present

## 2023-06-27 DIAGNOSIS — Z1212 Encounter for screening for malignant neoplasm of rectum: Secondary | ICD-10-CM | POA: Diagnosis not present

## 2023-06-27 DIAGNOSIS — E7800 Pure hypercholesterolemia, unspecified: Secondary | ICD-10-CM | POA: Diagnosis not present

## 2023-07-04 DIAGNOSIS — I10 Essential (primary) hypertension: Secondary | ICD-10-CM | POA: Diagnosis not present

## 2023-07-04 DIAGNOSIS — R82998 Other abnormal findings in urine: Secondary | ICD-10-CM | POA: Diagnosis not present

## 2023-07-04 DIAGNOSIS — Z1331 Encounter for screening for depression: Secondary | ICD-10-CM | POA: Diagnosis not present

## 2023-07-04 DIAGNOSIS — Z1339 Encounter for screening examination for other mental health and behavioral disorders: Secondary | ICD-10-CM | POA: Diagnosis not present

## 2023-07-04 DIAGNOSIS — E785 Hyperlipidemia, unspecified: Secondary | ICD-10-CM | POA: Diagnosis not present

## 2023-07-04 DIAGNOSIS — Z0000 Encounter for general adult medical examination without abnormal findings: Secondary | ICD-10-CM | POA: Diagnosis not present

## 2023-07-04 DIAGNOSIS — M19049 Primary osteoarthritis, unspecified hand: Secondary | ICD-10-CM | POA: Diagnosis not present

## 2023-11-08 ENCOUNTER — Other Ambulatory Visit: Payer: Self-pay | Admitting: Internal Medicine

## 2023-11-08 DIAGNOSIS — Z0000 Encounter for general adult medical examination without abnormal findings: Secondary | ICD-10-CM

## 2023-11-24 DIAGNOSIS — Z961 Presence of intraocular lens: Secondary | ICD-10-CM | POA: Diagnosis not present

## 2023-12-08 ENCOUNTER — Ambulatory Visit
Admission: RE | Admit: 2023-12-08 | Discharge: 2023-12-08 | Disposition: A | Discharge status: Home / Self Care | Payer: Medicare HMO | Source: Ambulatory Visit | Attending: Internal Medicine | Admitting: Internal Medicine

## 2023-12-08 DIAGNOSIS — Z1231 Encounter for screening mammogram for malignant neoplasm of breast: Secondary | ICD-10-CM | POA: Diagnosis not present

## 2023-12-08 DIAGNOSIS — Z0000 Encounter for general adult medical examination without abnormal findings: Secondary | ICD-10-CM

## 2023-12-13 ENCOUNTER — Other Ambulatory Visit: Payer: Self-pay | Admitting: Internal Medicine

## 2023-12-13 DIAGNOSIS — R928 Other abnormal and inconclusive findings on diagnostic imaging of breast: Secondary | ICD-10-CM

## 2023-12-15 ENCOUNTER — Ambulatory Visit: Payer: Medicare HMO

## 2023-12-22 ENCOUNTER — Ambulatory Visit
Admission: RE | Admit: 2023-12-22 | Discharge: 2023-12-22 | Disposition: A | Discharge status: Home / Self Care | Payer: Medicare HMO | Source: Ambulatory Visit | Attending: Internal Medicine | Admitting: Internal Medicine

## 2023-12-22 DIAGNOSIS — R928 Other abnormal and inconclusive findings on diagnostic imaging of breast: Secondary | ICD-10-CM

## 2023-12-22 DIAGNOSIS — N6322 Unspecified lump in the left breast, upper inner quadrant: Secondary | ICD-10-CM | POA: Diagnosis not present

## 2024-05-03 DIAGNOSIS — H903 Sensorineural hearing loss, bilateral: Secondary | ICD-10-CM | POA: Diagnosis not present

## 2024-07-05 DIAGNOSIS — E785 Hyperlipidemia, unspecified: Secondary | ICD-10-CM | POA: Diagnosis not present

## 2024-07-05 DIAGNOSIS — I10 Essential (primary) hypertension: Secondary | ICD-10-CM | POA: Diagnosis not present

## 2024-07-05 DIAGNOSIS — E7800 Pure hypercholesterolemia, unspecified: Secondary | ICD-10-CM | POA: Diagnosis not present

## 2024-07-10 DIAGNOSIS — I10 Essential (primary) hypertension: Secondary | ICD-10-CM | POA: Diagnosis not present

## 2024-07-10 DIAGNOSIS — G720 Drug-induced myopathy: Secondary | ICD-10-CM | POA: Diagnosis not present

## 2024-07-10 DIAGNOSIS — L237 Allergic contact dermatitis due to plants, except food: Secondary | ICD-10-CM | POA: Diagnosis not present

## 2024-07-10 DIAGNOSIS — E785 Hyperlipidemia, unspecified: Secondary | ICD-10-CM | POA: Diagnosis not present

## 2024-07-10 DIAGNOSIS — Z1331 Encounter for screening for depression: Secondary | ICD-10-CM | POA: Diagnosis not present

## 2024-07-10 DIAGNOSIS — R82998 Other abnormal findings in urine: Secondary | ICD-10-CM | POA: Diagnosis not present

## 2024-07-10 DIAGNOSIS — Z1339 Encounter for screening examination for other mental health and behavioral disorders: Secondary | ICD-10-CM | POA: Diagnosis not present

## 2024-07-10 DIAGNOSIS — Z0000 Encounter for general adult medical examination without abnormal findings: Secondary | ICD-10-CM | POA: Diagnosis not present

## 2024-07-10 DIAGNOSIS — T466X5A Adverse effect of antihyperlipidemic and antiarteriosclerotic drugs, initial encounter: Secondary | ICD-10-CM | POA: Diagnosis not present

## 2024-08-14 ENCOUNTER — Encounter (HOSPITAL_COMMUNITY): Payer: Self-pay

## 2024-08-14 ENCOUNTER — Other Ambulatory Visit: Payer: Self-pay

## 2024-08-14 ENCOUNTER — Emergency Department (HOSPITAL_COMMUNITY)
Admission: EM | Admit: 2024-08-14 | Discharge: 2024-08-14 | Disposition: A | Discharge status: Home / Self Care | Attending: Emergency Medicine | Admitting: Emergency Medicine

## 2024-08-14 ENCOUNTER — Emergency Department (HOSPITAL_COMMUNITY)

## 2024-08-14 DIAGNOSIS — S92014A Nondisplaced fracture of body of right calcaneus, initial encounter for closed fracture: Secondary | ICD-10-CM | POA: Insufficient documentation

## 2024-08-14 DIAGNOSIS — S99921A Unspecified injury of right foot, initial encounter: Secondary | ICD-10-CM | POA: Diagnosis not present

## 2024-08-14 DIAGNOSIS — Y92009 Unspecified place in unspecified non-institutional (private) residence as the place of occurrence of the external cause: Secondary | ICD-10-CM | POA: Insufficient documentation

## 2024-08-14 DIAGNOSIS — W11XXXA Fall on and from ladder, initial encounter: Secondary | ICD-10-CM | POA: Insufficient documentation

## 2024-08-14 DIAGNOSIS — S99911A Unspecified injury of right ankle, initial encounter: Secondary | ICD-10-CM | POA: Diagnosis not present

## 2024-08-14 DIAGNOSIS — S92001A Unspecified fracture of right calcaneus, initial encounter for closed fracture: Secondary | ICD-10-CM | POA: Diagnosis not present

## 2024-08-14 DIAGNOSIS — M25579 Pain in unspecified ankle and joints of unspecified foot: Secondary | ICD-10-CM | POA: Diagnosis not present

## 2024-08-14 DIAGNOSIS — Z743 Need for continuous supervision: Secondary | ICD-10-CM | POA: Diagnosis not present

## 2024-08-14 NOTE — ED Triage Notes (Signed)
 Pt BIB EMS from home. Pt jammed her right ankle getting off of a ladder at home. Swelling to right ankle.

## 2024-08-14 NOTE — ED Provider Notes (Signed)
 [place] EMERGENCY DEPARTMENT AT [facility] Provider Note   CSN: [id number] Arrival date & time: 08/14/24  1236     Patient presents with: Ankle Pain   Eileen Levy is a 78 y.o. female patient who presents to the emergency department today for further evaluation of right foot pain that occurred just prior to arrival.  Patient states that she lost her footing and fell down onto her right right foot approximately 3 feet.  Since then she has had pain to the plantar aspect of the foot near the heel.  Hurts worse with ambulating.  Denies any weakness or numbness in the foot.    Ankle Pain      Prior to Admission medications   Not on File    Allergies: Statins    Review of Systems  All other systems reviewed and are negative.   Updated Vital Signs BP (!) 176/82 (BP Location: Right Arm)   Pulse 65   Temp 98.1 F (36.7 C) (Oral)   Resp 18   Ht 5' 2 (1.575 m)   Wt 68 kg   SpO2 97%   BMI 27.44 kg/m   Physical Exam Vitals and nursing note reviewed.  Constitutional:      Appearance: Normal appearance.  HENT:     Head: Normocephalic and atraumatic.  Eyes:     General:        Right eye: No discharge.        Left eye: No discharge.     Conjunctiva/sclera: Conjunctivae normal.  Pulmonary:     Effort: Pulmonary effort is normal.  Musculoskeletal:       Feet:  Feet:     Comments: 2+ posterior tibialis pulse felt on the right and equal in comparison to the left.  Sensation is intact distally.  Good range of motion. Skin:    General: Skin is warm and dry.     Findings: No rash.  Neurological:     General: No focal deficit present.     Mental Status: She is alert.  Psychiatric:        Mood and Affect: Mood normal.        Behavior: Behavior normal.     (all labs ordered are listed, but only abnormal results are displayed) Labs Reviewed - No data to display  EKG: None  Radiology: CT Foot Right Wo Contrast Result Date: 08/14/2024 CLINICAL  DATA:  Calcaneal fracture.  Injury getting off ladder. EXAM: CT OF THE RIGHT FOOT WITHOUT CONTRAST TECHNIQUE: Multidetector CT imaging of the right foot was performed according to the standard protocol. Multiplanar CT image reconstructions were also generated. RADIATION DOSE REDUCTION: This exam was performed according to the departmental dose-optimization program which includes automated exposure control, adjustment of the mA and/or kV according to patient size and/or use of iterative reconstruction technique. COMPARISON:  Plain films today FINDINGS: Bones/Joint/Cartilage Fractures throughout the calcaneus. Fractures through the posterior calcaneus. Fractures in the mid and anterior calcaneus entered the subtalar joint. No subluxation or dislocation. Mildly displaced fracture fragments. Ligaments Suboptimally assessed by CT. Muscles and Tendons Negative Soft tissues Soft tissue swelling throughout the foot. IMPRESSION: Extensive mildly displaced calcaneal fractures as above. Electronically Signed   By: Franky Crease M.D.   On: 08/14/2024 19:25   DG Ankle 2 Views Right Result Date: 08/14/2024 CLINICAL DATA:  Status post trauma. EXAM: RIGHT ANKLE - 2 VIEW COMPARISON:  None Available. FINDINGS: A nondisplaced fracture deformity is seen extending through the right calcaneus. There is  no evidence of dislocation. There is no evidence of arthropathy or other focal bone abnormality. Mild to moderate severity soft tissue swelling is seen along the lateral aspect of the right ankle and proximal right foot. IMPRESSION: Nondisplaced fracture of the right calcaneus. Electronically Signed   By: Suzen Dials M.D.   On: 08/14/2024 15:17     Procedures   Medications Ordered in the ED - No data to display   Medical Decision Making Eileen Levy is a 78 y.o. female patient presents to the emerged apartment today for further evaluation of right ankle pain.  On exam she is neurovascularly intact.  Mild tenderness  to palpation over the right calcaneus.  She is hypertensive here without any other significant abnormalities.  Offered patient some pain medication which she declined.  X-ray was done in triage interpreted by myself.  I do see some evidence of right calcaneal fracture.  Will plan to get a CT scan, applied cam boot, and discharged with orthopedic follow-up.  CT scan reveals fracture calcaneus with mild displacement.  Patient in cam boot.  I will have her follow-up with orthopedist as stated above.  She is safe for discharge at this time.  Amount and/or Complexity of Data Reviewed Radiology: ordered.     Final diagnoses:  Closed nondisplaced fracture of body of right calcaneus, initial encounter    ED Discharge Orders     None          Theotis Peers [place], [place] 08/14/24 1947    Lenor Hollering, MD 08/15/24 1410

## 2024-08-14 NOTE — Progress Notes (Signed)
 Orthopedic Tech Progress Note Patient Details:  Eileen Levy January 10, 1946 [id number]  Ortho Devices Type of Ortho Device: CAM walker Ortho Device/Splint Location: right Ortho Device/Splint Interventions: Ordered, Application, Adjustment   Post Interventions Patient Tolerated: Well Instructions Provided: Adjustment of device, Care of device  [name] 08/14/2024, 4:43 PM

## 2024-08-14 NOTE — ED Notes (Signed)
Ortho tech called for CAM boot 

## 2024-08-14 NOTE — Discharge Instructions (Signed)
 Please call Dr. Jeraline office tomorrow and schedule an appointment.  I would like for you to get seen and evaluated by the orthopedist before you travel.  Take 600 mg of ibuprofen for pain control.  You can add on Tylenol as well.  You may return to the emergency department for any worsening symptoms.

## 2024-08-15 ENCOUNTER — Telehealth: Payer: Self-pay | Admitting: Radiology

## 2024-08-15 DIAGNOSIS — S92014A Nondisplaced fracture of body of right calcaneus, initial encounter for closed fracture: Secondary | ICD-10-CM | POA: Diagnosis not present

## 2024-08-15 NOTE — Telephone Encounter (Signed)
 I called patient and asked if she was told NOT to weightbear on her foot, she states that they asked to try it, but she couldn't. I advised that she should NOT WTB on it at this time. She states that she researched it and everything she read she not to [facility] on it. So she is not WTB on it at this time.

## 2024-08-22 ENCOUNTER — Ambulatory Visit: Admitting: Orthopedic Surgery

## 2024-08-22 DIAGNOSIS — S92011A Displaced fracture of body of right calcaneus, initial encounter for closed fracture: Secondary | ICD-10-CM

## 2024-08-22 NOTE — Progress Notes (Signed)
 Orthopedic Surgery Progress Note   Assessment: Patient is a 78 y.o. female with right calcaneus fracture DOI: 08/14/2024 (~1 week post-injury)   Plan: - Talked about the pros and cons of operative versus nonoperative management.  Explained that there is a risk of posttraumatic arthritis in the subtalar joint with either option.  There are significant risks associated with ORIF of calcaneus fractures.  After conversation, patient elected proceed with nonoperative treatment.  Given that the subtalar joint has no significant displacement, I feel that this treatment is reasonable.  Told her that if she does develop posttraumatic arthritis, subtalar fusion would be an option -NWB RLE in boot (will have her NWB for 10 weeks) - Patient should return to office in 4 weeks, x-rays at next visit: right calcaneus (lateral and Harris views)  ___________________________________________________________________________  History: Patient was on a ladder when she lost her balance and fell.  This was about 1 week ago.  She noted acute onset of right foot pain.  She presented to the ER where imaging was obtained and she was found to have a calcaneus fracture.  She was told that she could weight-bear as tolerated in a boot.  She had difficulty weightbearing.  She then called our office and I advised her to be nonweightbearing in the boot.  She has been nonweightbearing since that time.  She has not noticed any pain elsewhere besides the calcaneus.  She is not taking any medication for pain.   Physical Exam:  General: no acute distress, appears stated age Neurologic: alert, answering questions appropriately, following commands Respiratory: unlabored breathing on room air, symmetric chest rise Psychiatric: appropriate affect, normal cadence to speech  MSK:   -Right lower extremity  TTP with ecchymosis over the calcaneus.  No fracture blisters seen.  No open wounds seen.  No other tenderness to palpation over  remainder of the extremity.  Swelling seen around the calcaneus and plantar midfoot. Fires hip flexors, quadriceps, hamstrings, tibialis anterior, gastrocnemius and soleus, extensor hallucis longus Plantarflexes and dorsiflexes toes Sensation intact to light touch in sural, saphenous, tibial, deep peroneal, and superficial peroneal nerve distributions Foot warm and well perfused   Imaging:  CT of the right foot from 08/14/2024 was independently reviewed and interpreted, showing a comminuted fracture through the posterior aspect of the calcaneus.  The fracture extends anteriorly.  The subtalar joint is involved but there is no articular displacement or depression.  No other fracture seen.  No dislocation seen.   Patient name: Eileen Levy Patient MRN: [id number] Date: 08/22/24

## 2024-09-20 ENCOUNTER — Other Ambulatory Visit (INDEPENDENT_AMBULATORY_CARE_PROVIDER_SITE_OTHER): Payer: Self-pay

## 2024-09-20 ENCOUNTER — Ambulatory Visit: Admitting: Orthopedic Surgery

## 2024-09-20 DIAGNOSIS — S92011A Displaced fracture of body of right calcaneus, initial encounter for closed fracture: Secondary | ICD-10-CM | POA: Diagnosis not present

## 2024-09-20 NOTE — Progress Notes (Signed)
 Orthopedic Surgery Progress Note     Assessment: Patient is a 78 y.o. female with right calcaneus fracture DOI: 08/14/2024 (~5 weeks post-injury)     Plan: -Will continue with nonoperative management -NWB RLE in boot -OTC medications as needed for pain relief -Will plan to transition to her to weightbearing as tolerated in a boot at her next visit - Patient should return to office in 5 weeks, x-rays at next visit: right calcaneus (lateral and Harris views)   ___________________________________________________________________________   History: Patient sustained a right calcaneus fracture about 5 weeks ago.  She was last seen in the office about 4 weeks ago.  She has been in a boot and nonweightbearing with a rolling knee scooter since that time.  She is not having any pain in her foot and ankle.  She is not taking any medications for pain.  She has no complaints at today's visit.     Physical Exam:   General: no acute distress, appears stated age Neurologic: alert, answering questions appropriately, following commands Respiratory: unlabored breathing on room air, symmetric chest rise Psychiatric: appropriate affect, normal cadence to speech   MSK:    -Right lower extremity             Ecchymosis and swelling around the calcaneus have improved.  No open wounds seen.  No tenderness to palpation over the foot or ankle Fires hip flexors, quadriceps, hamstrings, tibialis anterior, gastrocnemius and soleus, extensor hallucis longus Plantarflexes and dorsiflexes toes Sensation intact to light touch in sural, saphenous, tibial, deep peroneal, and superficial peroneal nerve distributions Foot warm and well perfused     Imaging:  XRs of the right calcaneus from 09/20/2024 were independently reviewed and interpreted, showing a comminuted fracture of the calcaneus with slight depression of the subtalar articular surface.  No callus formation seen.  No new fracture seen.  CT of the right  foot from 08/14/2024 was independently reviewed and interpreted, showing a comminuted fracture through the posterior aspect of the calcaneus.  The fracture extends anteriorly.  The subtalar joint is involved but there is no articular displacement or depression.  No other fracture seen.  No dislocation seen.     Patient name: Eileen Levy Patient MRN: [id number] Date: 09/20/24

## 2024-10-08 ENCOUNTER — Encounter: Payer: Self-pay | Admitting: Radiology

## 2024-10-25 ENCOUNTER — Ambulatory Visit: Admitting: Orthopedic Surgery

## 2024-10-25 ENCOUNTER — Ambulatory Visit (INDEPENDENT_AMBULATORY_CARE_PROVIDER_SITE_OTHER): Payer: Self-pay

## 2024-10-25 DIAGNOSIS — S92011A Displaced fracture of body of right calcaneus, initial encounter for closed fracture: Secondary | ICD-10-CM

## 2024-10-25 NOTE — Progress Notes (Signed)
 Orthopedic Surgery Progress Note     Assessment: Patient is a 78 y.o. female with right calcaneus fracture DOI: 08/14/2024 (~10 weeks post-injury)     Plan: -Will continue with nonoperative management -NWB RLE in boot for one more week, then transition to weight bearing as tolerated in the boot, after 2 weeks she can transition to weightbearing as tolerated in regular shoes -OTC medications as needed for pain relief - Patient should return to office in 5 weeks, x-rays at next visit: right calcaneus (lateral and Harris views)   ___________________________________________________________________________   History: Patient has been doing well since she was last in the office.  She is not having any pain in the heel.  She has been nonweightbearing in a boot and using a knee scooter.  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  That is her biggest frustration is that she still needs to be in the boot.     Physical Exam:   General: no acute distress, appears stated age Neurologic: alert, answering questions appropriately, following commands Respiratory: unlabored breathing on room air, symmetric chest rise Psychiatric: appropriate affect, normal cadence to speech   MSK:    -Right lower extremity             No tenderness palpation over the foot or ankle including over the heel.  No pain with passive dorsiflexion and plantarflexion of the ankle.  No pain with passive range of motion at the hindfoot with inversion and eversion Fires hip flexors, quadriceps, hamstrings, tibialis anterior, gastrocnemius and soleus, extensor hallucis longus Plantarflexes and dorsiflexes toes Sensation intact to light touch in sural, saphenous, tibial, deep peroneal, and superficial peroneal nerve distributions Foot warm and well perfused     Imaging:  XRs of the right calcaneus from 10/25/2024 were independently reviewed and interpreted, showing a comminuted fracture of the  calcaneus with depression of the subtalar articular surface.  This is unchanged from prior films on 09/20/2024.  No new fracture seen.   CT of the right foot from 08/14/2024 was previously independently reviewed and interpreted, showing a comminuted fracture through the posterior aspect of the calcaneus.  The fracture extends anteriorly.  The subtalar joint is involved but there is no articular displacement or depression.  No other fracture seen.  No dislocation seen.     Patient name: Eileen Levy Patient MRN: [id number] Date: 10/25/24

## 2024-11-22 ENCOUNTER — Other Ambulatory Visit: Payer: Self-pay | Admitting: Internal Medicine

## 2024-11-22 DIAGNOSIS — Z1231 Encounter for screening mammogram for malignant neoplasm of breast: Secondary | ICD-10-CM

## 2024-12-10 ENCOUNTER — Encounter: Payer: Self-pay | Admitting: Orthopedic Surgery

## 2024-12-10 ENCOUNTER — Other Ambulatory Visit (INDEPENDENT_AMBULATORY_CARE_PROVIDER_SITE_OTHER)

## 2024-12-10 ENCOUNTER — Ambulatory Visit: Admitting: Orthopedic Surgery

## 2024-12-10 VITALS — BP 156/83 | HR 75 | Ht 62.0 in | Wt 150.0 lb

## 2024-12-10 DIAGNOSIS — S92011A Displaced fracture of body of right calcaneus, initial encounter for closed fracture: Secondary | ICD-10-CM

## 2024-12-10 DIAGNOSIS — M7671 Peroneal tendinitis, right leg: Secondary | ICD-10-CM

## 2024-12-10 NOTE — Progress Notes (Signed)
 Orthopedic Surgery Progress Note     Assessment: Patient is a 79 y.o. female with right calcaneus fracture DOI: 08/14/2024 (~4 months post-injury)     Plan: -Will continue with nonoperative management -Patient can continue to weight-bear as tolerated in regular shoes -Referred her to PT -Talked about icing and elevation at the end of the day -OTC medications as needed for pain relief - Patient should return to office in 5-6 weeks, x-rays at next visit: right calcaneus (lateral and Harris views)   ___________________________________________________________________________   History: Patient has noticed discomfort in her lateral ankle near the peroneal tendons and lateral malleolus at night.  She says if she hits it the wrong way she will wake up and has trouble getting back to sleep.  She said it is not pain it is more of a discomfort.  It lasts for a few seconds but because she is awakened by it she cannot go back to sleep.  She does not have the pain longer than a short duration.  She does not have any pain during the day.  She has been weightbearing in regular shoes.  She has noticed swelling around the hindfoot and ankle.  She has been using compression stockings and elevating which has helped.     Physical Exam:   General: no acute distress, appears stated age Neurologic: alert, answering questions appropriately, following commands Respiratory: unlabored breathing on room air, symmetric chest rise Psychiatric: appropriate affect, normal cadence to speech   MSK:    -Right lower extremity             No tenderness palpation over the foot or ankle including over the heel.  No pain with passive dorsiflexion and plantarflexion of the ankle.  No pain with passive range of motion at the hindfoot with inversion and eversion. Some swelling seen just posterior to the lateral malleolus in the area of the peroneal tendons  Weightbearing in regular shoes, nonantalgic gait  Ankle ROM from  neutral to 25 degrees plantarflexion EHL/TA/GSC intact Plantarflexes and dorsiflexes toes Sensation intact to light touch in sural, saphenous, tibial, deep peroneal, and superficial peroneal nerve distributions Foot warm and well perfused     Imaging:  XRs of the right calcaneus from 12/10/2024 were independently reviewed and interpreted, showing no significant change in alignment in the calcaneus fracture from prior films on 10/25/2024.  No new fracture seen.  No dislocation seen.     Patient name: Eileen Levy Patient MRN: [id number] Date: 12/10/2024

## 2024-12-13 ENCOUNTER — Ambulatory Visit: Admitting: Rehabilitative and Restorative Service Providers"

## 2024-12-13 ENCOUNTER — Encounter: Payer: Self-pay | Admitting: Rehabilitative and Restorative Service Providers"

## 2024-12-13 DIAGNOSIS — M6281 Muscle weakness (generalized): Secondary | ICD-10-CM | POA: Diagnosis not present

## 2024-12-13 DIAGNOSIS — R262 Difficulty in walking, not elsewhere classified: Secondary | ICD-10-CM

## 2024-12-13 DIAGNOSIS — R600 Localized edema: Secondary | ICD-10-CM

## 2024-12-13 NOTE — Therapy (Signed)
 " OUTPATIENT PHYSICAL THERAPY LOWER EXTREMITY EVALUATION   Patient Name: Eileen Levy MRN: [id number] DOB:[birth year], 79 y.o., female Today's Date: 12/13/2024  END OF SESSION:  PT End of Session - 12/13/24 1456     Visit Number 1    Number of Visits 16    Date for Recertification  02/07/25    Authorization Type [employer] MEDICARE    Progress Note Due on Visit 10    PT Start Time 1021    PT Stop Time 1104    PT Time Calculation (min) 43 min    Activity Tolerance Patient tolerated treatment well;No increased pain;Patient limited by pain    Behavior During Therapy [facility] for tasks assessed/performed          History reviewed. No pertinent past medical history. History reviewed. No pertinent surgical history. There are no active problems to display for this patient.   PCP: Toribio MATSU. Yolande, MD  REFERRING PROVIDER: Ozell delena Ada MD  REFERRING DIAG: [phone] (ICD-10-CM) - Closed displaced fracture of body of right calcaneus, initial encounter M76.71 (ICD-10-CM) - Peroneal tendinitis, right leg  THERAPY DIAG:  Difficulty in walking, not elsewhere classified - Plan: PT plan of care cert/re-cert  Muscle weakness (generalized) - Plan: PT plan of care cert/re-cert  Localized edema - Plan: PT plan of care cert/re-cert  Rationale for Evaluation and Treatment: Rehabilitation  ONSET DATE: August 14, 2024  SUBJECTIVE:   SUBJECTIVE STATEMENT: Eileen Levy slipped and jumped off a ladder on 08/14/2024 landing upright.  Immediate right heel pain.  She went to the ED and was diagnosed with a right calcaneal fracture.  3 months of non weight-bearing after that.  She last saw Dr. Ada 12/10/2024.  She leaves for [place] 02/01/2025 and wants to be ready to go at that time.  X-rays Monday look good.  No restrictions.    PERTINENT HISTORY: NA  PAIN:  Are you having pain? Yes: NPRS scale: 0-3/10 over the past week Pain location: Tendon on the bone on the outside of the ankle Pain  description: Old ache Aggravating factors: When it gets hit at night Relieving factors: Don't hit it, move the foot  PRECAUTIONS: None  RED FLAGS: None   WEIGHT BEARING RESTRICTIONS: No  FALLS:  Has patient fallen in last 6 months? Yes. Number of falls several falls due to getting used to the crutches  LIVING ENVIRONMENT: Lives with: lives alone Lives in: House/apartment Stairs: Does OK, but uses the crutch Has following equipment at home: Crutches, 1 crutch  OCCUPATION: Retired  PLOF: Independent  PATIENT GOALS: Be ready for a trip to [place] at the end of February  NEXT MD VISIT: 01/21/2025  OBJECTIVE:  Note: Objective measures were completed at Evaluation unless otherwise noted.  DIAGNOSTIC FINDINGS: XRs of the right calcaneus from 12/10/2024 were independently reviewed and  interpreted, showing no significant change in alignment in the calcaneus  fracture from prior films on 10/25/2024.  No new fracture seen.  No  dislocation seen.  PATIENT SURVEYS:  PSFS: THE PATIENT SPECIFIC FUNCTIONAL SCALE  Place score of 0-10 (0 = unable to perform activity and 10 = able to perform activity at the same level as before injury or problem)  Activity Date: 12/13/2024    Walk 5    2.  Sleep through the night 5    3.     4.      Total Score 5      Total Score = Sum of activity scores/number of activities  Minimally  Detectable Change: 3 points (for single activity); 2 points (for average score)  [facility] (nd). The Patient Specific Functional Scale . Retrieved from [email or web address]   COGNITION: Overall cognitive status: Within functional limits for tasks assessed     SENSATION: Eileen Levy had no complaints of new peripheral pain or paresthesias  EDEMA:  Noted and not objectively assessed   LOWER EXTREMITY ROM:  Active ROM Left/Right    Hip flexion    Hip extension    Hip abduction    Hip adduction     Hip internal rotation    Hip external rotation    Knee flexion    Knee extension    Ankle dorsiflexion 11/10   Ankle plantarflexion    Ankle inversion    Ankle eversion     (Blank rows = not tested)  LOWER EXTREMITY STRENGTH:  In pounds with a hand-held dynamometer Left/Right 12/13/2024   Hip flexion    Hip extension    Hip abduction    Hip adduction    Hip internal rotation    Hip external rotation    Knee flexion    Knee extension    Ankle dorsiflexion    Ankle plantarflexion    Ankle inversion 40.5/13.2   Ankle eversion 17.1/19.2    (Blank rows = not tested)  GAIT: Distance walked: 100 feet Assistive device utilized: 1 crutch Level of assistance: Modified independence Comments: Eileen Levy had several falls while getting used to the crutches initially                                                                                                                                TREATMENT DATE:  12/13/2024 Standing heel raises, shift weight to the right side and slow eccentrics 2 sets of 10 for 3 seconds Ankle Thera-Band Inversion Green 10 x slow eccentrics Ankle Thera-Band Eversion Green 10 x slow eccentrics  97535: Discussed exam findings; day 1 HEP and answered questions   PATIENT EDUCATION:  Education details: See above Person educated: Patient Education method: Explanation, Demonstration, Tactile cues, Verbal cues, and Handouts Education comprehension: verbalized understanding, returned demonstration, verbal cues required, tactile cues required, and needs further education  HOME EXERCISE PROGRAM: Access Code: [email or web address] URL: [email or web address] Date: 12/13/2024 Prepared by: Lamar Ivory  Exercises - Heel Raise  - 5-10 x daily - 7 x weekly - 1 sets - 5-10 reps - 3 seconds hold - Ankle Inversion with Resistance  - 2 x daily - 7 x weekly - 2 sets - 10 reps - 3 second hold - Ankle Eversion with Resistance  - 2 x daily - 7 x weekly - 2 sets - 10 reps  - 3 second hold  ASSESSMENT:  CLINICAL IMPRESSION: Patient is a 79 y.o. female who was seen today for physical therapy evaluation and treatment for S92.011A (ICD-10-CM) - Closed displaced fracture of body of right calcaneus, initial encounter M76.71 (ICD-10-CM) - Peroneal tendinitis, right leg.  Jacquiline slipped and fell off a ladder 08/14/2024 resulting in a calcaneous fracture.  She has been cleared to participate in supervised physical therapy with a goal of being ready for a trip to [place] later in February.  Her ankle dorsiflexion active range of motion is better than expected while her ankle strength is poor.  She will benefit from appropriate ankle and lower extremity strength progressions, balance and functional activities to meet the below listed goals.  OBJECTIVE IMPAIRMENTS: Abnormal gait, cardiopulmonary status limiting activity, decreased activity tolerance, decreased balance, decreased endurance, decreased knowledge of condition, difficulty walking, decreased strength, decreased safety awareness, increased edema, impaired perceived functional ability, and pain.   ACTIVITY LIMITATIONS: carrying, standing, squatting, sleeping, stairs, bed mobility, and locomotion level  PARTICIPATION LIMITATIONS: driving, shopping, and community activity  PERSONAL FACTORS: No personal factors are affecting Eileen Levy's functional outcome.   REHAB POTENTIAL: Good  CLINICAL DECISION MAKING: Stable/uncomplicated  EVALUATION COMPLEXITY: Low   GOALS: Goals reviewed with patient? Yes  SHORT TERM GOALS: Target date: 01/10/2025 Eileen Levy will be independent with her day 1 home exercise program Baseline: Started 12/13/2024 Goal status: INITIAL  2.  Improve bilateral ankle strength for inversion and eversion to at least 35 pounds Baseline: Ankle inversion 40.5/13.2 pounds and ankle eversion 17.1/19.2 pounds Goal status: INITIAL  3.  Eileen Levy will need her cane less than 50% of the time with walking Baseline: 100% of  the time outside the house, only goes without it in the bathroom and kitchen Goal status: INITIAL   LONG TERM GOALS: Target date: 02/07/2025  Improve patient-specific functional score to at least 7 Baseline: 5 Goal status: INITIAL  2.  Eileen Levy will report right heel pain consistently 0-2/10 and it will not wake her up at night Baseline: 3/10 at night and it affects sleep Goal status: INITIAL  3.  Eileen Levy will be comfortable without her cane at 100% of the time Baseline: See above Goal status: INITIAL  4.  Improve bilateral ankle strength for inversion and eversion to at least 40 pounds Baseline: See short-term goal Goal status: INITIAL  5.  Eileen Levy will be independent with her long-term maintenance home exercise program at discharge Baseline: Started 12/13/2024 Goal status: INITIAL  PLAN:  PT FREQUENCY: 2x/week  PT DURATION: 8 weeks  PLANNED INTERVENTIONS: 97750- Physical Performance Testing, 97110-Therapeutic exercises, 97530- Therapeutic activity, 97112- Neuromuscular re-education, [phone]- Self Care, 02883- Gait training, [phone]- Vasopneumatic device, Patient/Family education, Balance training, Stair training, and Cryotherapy  PLAN FOR NEXT SESSION: Emphasis on lower extremity and ankle strength, balance and functional progressions to prepare her for a trip to [place] in late February of this year.   Myer LELON Ivory, PT, MPT 12/13/2024, 3:11 PM  "

## 2024-12-15 NOTE — Therapy (Signed)
 " OUTPATIENT PHYSICAL THERAPY LOWER EXTREMITY TREATMENT   Patient Name: Eileen Levy MRN: [id number] DOB:Apr 30, 1946, 79 y.o., female Today's Date: 12/17/2024  END OF SESSION:  PT End of Session - 12/17/24 1035     Visit Number 2    Number of Visits 16    Date for Recertification  02/07/25    Authorization Type [employer] MEDICARE    PT Start Time 1015    PT Stop Time 1055    PT Time Calculation (min) 40 min    Activity Tolerance Patient tolerated treatment well    Behavior During Therapy [facility] for tasks assessed/performed           History reviewed. No pertinent past medical history. History reviewed. No pertinent surgical history. There are no active problems to display for this patient.   PCP: Toribio MATSU. Yolande, MD  REFERRING PROVIDER: Ozell delena Ada MD  REFERRING DIAG: [phone] (ICD-10-CM) - Closed displaced fracture of body of right calcaneus, initial encounter M76.71 (ICD-10-CM) - Peroneal tendinitis, right leg  THERAPY DIAG:  Difficulty in walking, not elsewhere classified  Muscle weakness (generalized)  Localized edema  Rationale for Evaluation and Treatment: Rehabilitation  ONSET DATE: August 14, 2024  SUBJECTIVE:   SUBJECTIVE STATEMENT: Eileen Levy reports some discomfort- mainly at night.  PERTINENT HISTORY: NA  PAIN:  Are you having pain? Yes: NPRS scale: 2/10 over the past week Pain location: Tendon on the bone on the outside of the ankle Pain description: Old ache Aggravating factors: When it gets hit at night Relieving factors: Don't hit it, move the foot  PRECAUTIONS: None  RED FLAGS: None   WEIGHT BEARING RESTRICTIONS: No  FALLS:  Has patient fallen in last 6 months? Yes. Number of falls several falls due to getting used to the crutches  LIVING ENVIRONMENT: Lives with: lives alone Lives in: House/apartment Stairs: Does OK, but uses the crutch Has following equipment at home: Crutches, 1 crutch  OCCUPATION:  Retired  PLOF: Independent  PATIENT GOALS: Be ready for a trip to [place] at the end of February  NEXT MD VISIT: 01/21/2025  OBJECTIVE:  Note: Objective measures were completed at Evaluation unless otherwise noted.  DIAGNOSTIC FINDINGS: XRs of the right calcaneus from 12/10/2024 were independently reviewed and  interpreted, showing no significant change in alignment in the calcaneus  fracture from prior films on 10/25/2024.  No new fracture seen.  No  dislocation seen.  PATIENT SURVEYS:  PSFS: THE PATIENT SPECIFIC FUNCTIONAL SCALE  Place score of 0-10 (0 = unable to perform activity and 10 = able to perform activity at the same level as before injury or problem)  Activity Date: 12/13/2024    Walk 5    2.  Sleep through the night 5    3.     4.      Total Score 5      Total Score = Sum of activity scores/number of activities  Minimally Detectable Change: 3 points (for single activity); 2 points (for average score)  [name] (nd). The Patient Specific Functional Scale . Retrieved from [email or web address]   COGNITION: Overall cognitive status: Within functional limits for tasks assessed     SENSATION: Eileen Levy had no complaints of new peripheral pain or paresthesias  EDEMA:  Noted and not objectively assessed   LOWER EXTREMITY ROM:  Active ROM Left/Right    Hip flexion    Hip extension    Hip abduction    Hip adduction    Hip internal rotation  Hip external rotation    Knee flexion    Knee extension    Ankle dorsiflexion 11/10   Ankle plantarflexion    Ankle inversion    Ankle eversion     (Blank rows = not tested)  LOWER EXTREMITY STRENGTH:  In pounds with a hand-held dynamometer Left/Right 12/13/2024   Hip flexion    Hip extension    Hip abduction    Hip adduction    Hip internal rotation    Hip external rotation    Knee flexion    Knee extension    Ankle dorsiflexion    Ankle  plantarflexion    Ankle inversion 40.5/13.2   Ankle eversion 17.1/19.2    (Blank rows = not tested)  GAIT: Distance walked: 100 feet Assistive device utilized: 1 crutch Level of assistance: Modified independence Comments: Eileen Levy had several falls while getting used to the crutches initially                                                                                                                                TREATMENT DATE: R calcaneus Fx 12/17/24 Ankle TB 2x10 all directions  SLR/SR 3x10 each 1.5# Squats 3x10 Review of ambulation with cane Access Code: [email or web address] URL: [email or web address] Date: 12/17/2024 Prepared by: Burnard Meth  Exercises - Heel Raise  - 5-10 x daily - 7 x weekly - 1 sets - 5-10 reps - 3 seconds hold - Ankle Inversion with Resistance  - 2 x daily - 7 x weekly - 2 sets - 10 reps - 3 second hold - Ankle Eversion with Resistance  - 2 x daily - 7 x weekly - 2 sets - 10 reps - 3 second hold - Long Sitting Ankle Plantar Flexion with Resistance  - 2 x daily - 7 x weekly - 2 sets - 10 reps - Long Sitting Ankle Dorsiflexion with Anchored Resistance  - 2 x daily - 7 x weekly - 2 sets - 10 reps  12/13/2024 Standing heel raises, shift weight to the right side and slow eccentrics 2 sets of 10 for 3 seconds Ankle Thera-Band Inversion Green 10 x slow eccentrics Ankle Thera-Band Eversion Green 10 x slow eccentrics  97535: Discussed exam findings; day 1 HEP and answered questions   PATIENT EDUCATION:  Education details: See above Person educated: Patient Education method: Explanation, Demonstration, Tactile cues, Verbal cues, and Handouts Education comprehension: verbalized understanding, returned demonstration, verbal cues required, tactile cues required, and needs further education  HOME EXERCISE PROGRAM: Access Code: [email or web address] URL: [email or web address] Date: 12/13/2024 Prepared by: Lamar Ivory  Exercises - Heel Raise  - 5-10 x  daily - 7 x weekly - 1 sets - 5-10 reps - 3 seconds hold - Ankle Inversion with Resistance  - 2 x daily - 7 x weekly - 2 sets - 10 reps - 3 second hold - Ankle Eversion with Resistance  - 2 x daily - 7 x weekly - 2 sets -  10 reps - 3 second hold  ASSESSMENT:  CLINICAL IMPRESSION: Patient needed VC for proper sequence with SPC.  Demonstrated understanding.  Patients cane is not adjustable to correct height; discussed with patient. OBJECTIVE IMPAIRMENTS: Abnormal gait, cardiopulmonary status limiting activity, decreased activity tolerance, decreased balance, decreased endurance, decreased knowledge of condition, difficulty walking, decreased strength, decreased safety awareness, increased edema, impaired perceived functional ability, and pain.   ACTIVITY LIMITATIONS: carrying, standing, squatting, sleeping, stairs, bed mobility, and locomotion level  PARTICIPATION LIMITATIONS: driving, shopping, and community activity  PERSONAL FACTORS: No personal factors are affecting Eudelia's functional outcome.   REHAB POTENTIAL: Good  CLINICAL DECISION MAKING: Stable/uncomplicated  EVALUATION COMPLEXITY: Low   GOALS: Goals reviewed with patient? Yes  SHORT TERM GOALS: Target date: 01/10/2025 Abagael will be independent with her day 1 home exercise program Baseline: Started 12/13/2024 Goal status: MET 12/17/24  2.  Improve bilateral ankle strength for inversion and eversion to at least 35 pounds Baseline: Ankle inversion 40.5/13.2 pounds and ankle eversion 17.1/19.2 pounds Goal status: INITIAL  3.  Brityn will need her cane less than 50% of the time with walking Baseline: 100% of the time outside the house, only goes without it in the bathroom and kitchen Goal status: INITIAL   LONG TERM GOALS: Target date: 02/07/2025  Improve patient-specific functional score to at least 7 Baseline: 5 Goal status: INITIAL  2.  Gwendolyn will report right heel pain consistently 0-2/10 and it will not wake her up at  night Baseline: 3/10 at night and it affects sleep Goal status: INITIAL  3.  Petrina will be comfortable without her cane at 100% of the time Baseline: See above Goal status: INITIAL  4.  Improve bilateral ankle strength for inversion and eversion to at least 40 pounds Baseline: See short-term goal Goal status: INITIAL  5.  Lezley will be independent with her long-term maintenance home exercise program at discharge Baseline: Started 12/13/2024 Goal status: INITIAL  PLAN:  PT FREQUENCY: 2x/week  PT DURATION: 8 weeks  PLANNED INTERVENTIONS: 97750- Physical Performance Testing, 97110-Therapeutic exercises, 97530- Therapeutic activity, 97112- Neuromuscular re-education, [phone]- Self Care, 02883- Gait training, [phone]- Vasopneumatic device, Patient/Family education, Balance training, Stair training, and Cryotherapy  PLAN FOR NEXT SESSION: Continue with emphasis on lower extremity and ankle strength, balance and functional progressions to prepare her for a trip to [place] in late February of this year.   Burnard CHRISTELLA Meth, PT 12/17/2024, 10:57 AM  "

## 2024-12-17 ENCOUNTER — Ambulatory Visit

## 2024-12-17 DIAGNOSIS — R262 Difficulty in walking, not elsewhere classified: Secondary | ICD-10-CM | POA: Diagnosis not present

## 2024-12-17 DIAGNOSIS — R600 Localized edema: Secondary | ICD-10-CM | POA: Diagnosis not present

## 2024-12-17 DIAGNOSIS — M6281 Muscle weakness (generalized): Secondary | ICD-10-CM

## 2024-12-21 ENCOUNTER — Ambulatory Visit

## 2024-12-21 DIAGNOSIS — R262 Difficulty in walking, not elsewhere classified: Secondary | ICD-10-CM | POA: Diagnosis not present

## 2024-12-21 DIAGNOSIS — M6281 Muscle weakness (generalized): Secondary | ICD-10-CM | POA: Diagnosis not present

## 2024-12-21 DIAGNOSIS — R600 Localized edema: Secondary | ICD-10-CM

## 2024-12-21 NOTE — Therapy (Signed)
 " OUTPATIENT PHYSICAL THERAPY LOWER EXTREMITY TREATMENT   Patient Name: Eileen Levy MRN: [id number] DOB:02-02-1946, 79 y.o., female Today's Date: 12/21/2024  END OF SESSION:  PT End of Session - 12/21/24 1019     Visit Number 3    Number of Visits 16    Date for Recertification  02/07/25    Authorization Type [employer] MEDICARE    PT Start Time 1019    PT Stop Time 1100    PT Time Calculation (min) 41 min    Activity Tolerance Patient tolerated treatment well    Behavior During Therapy [facility] for tasks assessed/performed            History reviewed. No pertinent past medical history. History reviewed. No pertinent surgical history. There are no active problems to display for this patient.   PCP: Eileen MATSU. Yolande, MD  REFERRING PROVIDER: Ozell delena Ada MD  REFERRING DIAG: [phone] (ICD-10-CM) - Closed displaced fracture of body of right calcaneus, initial encounter M76.71 (ICD-10-CM) - Peroneal tendinitis, right leg  THERAPY DIAG:  Difficulty in walking, not elsewhere classified  Muscle weakness (generalized)  Localized edema  Rationale for Evaluation and Treatment: Rehabilitation  ONSET DATE: August 14, 2024  SUBJECTIVE:   SUBJECTIVE STATEMENT: Patient reporting stiffness in the anterior ankle joint that is worst in the morning after not moving it and at night after a full day of activity. She has started taking ibuprofen at night in order to decrease night time waking due to discomfort.  PERTINENT HISTORY: NA  PAIN:  Are you having pain? Yes: NPRS scale: 2/10 stiffness Pain location: Tendon on the bone on the outside of the ankle Pain description: Old ache Aggravating factors: When it gets hit at night Relieving factors: Don't hit it, move the foot  PRECAUTIONS: None  RED FLAGS: None   WEIGHT BEARING RESTRICTIONS: No  FALLS:  Has patient fallen in last 6 months? Yes. Number of falls several falls due to getting used to the  crutches  LIVING ENVIRONMENT: Lives with: lives alone Lives in: House/apartment Stairs: Does OK, but uses the crutch Has following equipment at home: Crutches, 1 crutch  OCCUPATION: Retired  PLOF: Independent  PATIENT GOALS: Be ready for a trip to [place] at the end of February  NEXT MD VISIT: 01/21/2025  OBJECTIVE:  Note: Objective measures were completed at Evaluation unless otherwise noted.  DIAGNOSTIC FINDINGS: XRs of the right calcaneus from 12/10/2024 were independently reviewed and  interpreted, showing no significant change in alignment in the calcaneus  fracture from prior films on 10/25/2024.  No new fracture seen.  No  dislocation seen.  PATIENT SURVEYS:  PSFS: THE PATIENT SPECIFIC FUNCTIONAL SCALE  Place score of 0-10 (0 = unable to perform activity and 10 = able to perform activity at the same level as before injury or problem)  Activity Date: 12/13/2024    Walk 5    2.  Sleep through the night 5    3.     4.      Total Score 5      Total Score = Sum of activity scores/number of activities  Minimally Detectable Change: 3 points (for single activity); 2 points (for average score)  [facility] (nd). The Patient Specific Functional Scale . Retrieved from [email or web address]   COGNITION: Overall cognitive status: Within functional limits for tasks assessed     SENSATION: Eileen Levy had no complaints of new peripheral pain or paresthesias  EDEMA:  Noted and not objectively assessed  LOWER EXTREMITY ROM:  Active ROM Left/Right    Hip flexion    Hip extension    Hip abduction    Hip adduction    Hip internal rotation    Hip external rotation    Knee flexion    Knee extension    Ankle dorsiflexion 11/10   Ankle plantarflexion    Ankle inversion    Ankle eversion     (Blank rows = not tested)  LOWER EXTREMITY STRENGTH:  In pounds with a hand-held dynamometer Left/Right 12/13/2024   Hip  flexion    Hip extension    Hip abduction    Hip adduction    Hip internal rotation    Hip external rotation    Knee flexion    Knee extension    Ankle dorsiflexion    Ankle plantarflexion    Ankle inversion 40.5/13.2   Ankle eversion 17.1/19.2    (Blank rows = not tested)  GAIT: Distance walked: 100 feet Assistive device utilized: 1 crutch Level of assistance: Modified independence Comments: Eileen Levy had several falls while getting used to the crutches initially                                                                                                                                TREATMENT DATE: R calcaneus Fx 12/21/2024 TherAct:  Nustep level 4 for 8 minutes  Calf raises into toe raises 1x20 with bilat UE support for stability  Tap downs with 4 step and slight UE support 2x10 each side  Lateral tap down with 4 step and UE support 2x10 each side    Neuro Re-Ed:  Ankle circles performed standing with circle rocker board 1x10 CCW, CW  Fwd/back on rocker board for motor control 1x20   Semi tandem stance 1x30s each leg back  Tandem stance 1x30s each leg back  Square taps standing on black mat, 1x6 squares each  Lateral walks on foam with stepping off/on in order to challenge transition to different surfaces 10x down and back ; one instance of large posterior LOB with patient able to use UEs on // bars to self-correct   12/17/24 Ankle TB 2x10 all directions  SLR/SR 3x10 each 1.5# Squats 3x10 Review of ambulation with cane Access Code: [email or web address] URL: [email or web address] Date: 12/17/2024 Prepared by: Eileen Levy  Exercises - Heel Raise  - 5-10 x daily - 7 x weekly - 1 sets - 5-10 reps - 3 seconds hold - Ankle Inversion with Resistance  - 2 x daily - 7 x weekly - 2 sets - 10 reps - 3 second hold - Ankle Eversion with Resistance  - 2 x daily - 7 x weekly - 2 sets - 10 reps - 3 second hold - Long Sitting Ankle Plantar Flexion with Resistance  - 2 x daily  - 7 x weekly - 2 sets - 10 reps - Long Sitting Ankle Dorsiflexion with Anchored Resistance  - 2 x daily -  7 x weekly - 2 sets - 10 reps  12/13/2024 Standing heel raises, shift weight to the right side and slow eccentrics 2 sets of 10 for 3 seconds Ankle Thera-Band Inversion Green 10 x slow eccentrics Ankle Thera-Band Eversion Green 10 x slow eccentrics  97535: Discussed exam findings; day 1 HEP and answered questions   PATIENT EDUCATION:  Education details: See above Person educated: Patient Education method: Explanation, Demonstration, Tactile cues, Verbal cues, and Handouts Education comprehension: verbalized understanding, returned demonstration, verbal cues required, tactile cues required, and needs further education  HOME EXERCISE PROGRAM: Access Code: [email or web address] URL: [email or web address] Date: 12/13/2024 Prepared by: Lamar Ivory  Exercises - Heel Raise  - 5-10 x daily - 7 x weekly - 1 sets - 5-10 reps - 3 seconds hold - Ankle Inversion with Resistance  - 2 x daily - 7 x weekly - 2 sets - 10 reps - 3 second hold - Ankle Eversion with Resistance  - 2 x daily - 7 x weekly - 2 sets - 10 reps - 3 second hold  ASSESSMENT:  CLINICAL IMPRESSION: Patient arrived to session noting continued stiffness/soreness in anterior ankle upon waking in the morning and at night before bed after an active day. Patient ambulating with SPC in hand, though does not use it throughout gait cycle. Patient tolerated all activities this date and will benefit from continued challenged with strength, balance, and motor control.     OBJECTIVE IMPAIRMENTS: Abnormal gait, cardiopulmonary status limiting activity, decreased activity tolerance, decreased balance, decreased endurance, decreased knowledge of condition, difficulty walking, decreased strength, decreased safety awareness, increased edema, impaired perceived functional ability, and pain.   ACTIVITY LIMITATIONS: carrying, standing,  squatting, sleeping, stairs, bed mobility, and locomotion level  PARTICIPATION LIMITATIONS: driving, shopping, and community activity  PERSONAL FACTORS: No personal factors are affecting Tamarah's functional outcome.   REHAB POTENTIAL: Good  CLINICAL DECISION MAKING: Stable/uncomplicated  EVALUATION COMPLEXITY: Low   GOALS: Goals reviewed with patient? Yes  SHORT TERM GOALS: Target date: 01/10/2025 Lakendra will be independent with her day 1 home exercise program Baseline: Started 12/13/2024 Goal status: MET 12/17/24  2.  Improve bilateral ankle strength for inversion and eversion to at least 35 pounds Baseline: Ankle inversion 40.5/13.2 pounds and ankle eversion 17.1/19.2 pounds Goal status: INITIAL  3.  Danelia will need her cane less than 50% of the time with walking Baseline: 100% of the time outside the house, only goes without it in the bathroom and kitchen Goal status: INITIAL   LONG TERM GOALS: Target date: 02/07/2025  Improve patient-specific functional score to at least 7 Baseline: 5 Goal status: INITIAL  2.  Zachary will report right heel pain consistently 0-2/10 and it will not wake her up at night Baseline: 3/10 at night and it affects sleep Goal status: INITIAL  3.  Verlon will be comfortable without her cane at 100% of the time Baseline: See above Goal status: INITIAL  4.  Improve bilateral ankle strength for inversion and eversion to at least 40 pounds Baseline: See short-term goal Goal status: INITIAL  5.  Jacqulene will be independent with her long-term maintenance home exercise program at discharge Baseline: Started 12/13/2024 Goal status: INITIAL  PLAN:  PT FREQUENCY: 2x/week  PT DURATION: 8 weeks  PLANNED INTERVENTIONS: 97750- Physical Performance Testing, 97110-Therapeutic exercises, 97530- Therapeutic activity, 97112- Neuromuscular re-education, 97535- Self Care, 02883- Gait training, [phone]- Vasopneumatic device, Patient/Family education, Balance training,  Stair training, and Cryotherapy  PLAN FOR NEXT SESSION:  Continue with emphasis on  lower extremity and ankle strength, balance and functional progressions to prepare her for a trip to [place] in late February of this year.   Susannah Daring, PT, DPT 12/21/24 12:57 PM   "

## 2024-12-23 NOTE — Therapy (Signed)
 " OUTPATIENT PHYSICAL THERAPY LOWER EXTREMITY TREATMENT   Patient Name: Eileen Levy MRN: [id number] DOB:January 20, 1946, 79 y.o., female Today's Date: 12/25/2024  END OF SESSION:  PT End of Session - 12/25/24 1314     Visit Number 4    Number of Visits 16    Date for Recertification  02/07/25    Authorization Type [employer] MEDICARE    PT Start Time 0930    PT Stop Time 1008    PT Time Calculation (min) 38 min    Activity Tolerance Patient tolerated treatment well    Behavior During Therapy [facility] for tasks assessed/performed             History reviewed. No pertinent past medical history. History reviewed. No pertinent surgical history. There are no active problems to display for this patient.   PCP: Eileen Levy  REFERRING PROVIDER: Ozell delena Ada Levy  REFERRING DIAG: [id number] (ICD-10-CM) - Closed displaced fracture of body of right calcaneus, initial encounter M76.71 (ICD-10-CM) - Peroneal tendinitis, right leg  THERAPY DIAG:  Difficulty in walking, not elsewhere classified  Muscle weakness (generalized)  Localized edema  Rationale for Evaluation and Treatment: Rehabilitation  ONSET DATE: August 14, 2024  SUBJECTIVE:   SUBJECTIVE STATEMENT: Patient reporting no complaints, just occasional stiffness.  PERTINENT HISTORY: NA  PAIN:  Are you having pain? Yes: NPRS scale: 2/10  Pain location: Tendon on the bone on the outside of the ankle Pain description: Old ache Aggravating factors: When it gets hit at night Relieving factors: Don't hit it, move the foot  PRECAUTIONS: None  RED FLAGS: None   WEIGHT BEARING RESTRICTIONS: No  FALLS:  Has patient fallen in last 6 months? Yes. Number of falls several falls due to getting used to the crutches  LIVING ENVIRONMENT: Lives with: lives alone Lives in: House/apartment Stairs: Does OK, but uses the crutch Has following equipment at home: Crutches, 1 crutch  OCCUPATION: Retired  PLOF:  Independent  PATIENT GOALS: Be ready for a trip to [place] at the end of February  NEXT Levy VISIT: 01/21/2025  OBJECTIVE:  Note: Objective measures were completed at Evaluation unless otherwise noted.  DIAGNOSTIC FINDINGS: XRs of the right calcaneus from 12/10/2024 were independently reviewed and  interpreted, showing no significant change in alignment in the calcaneus  fracture from prior films on 10/25/2024.  No new fracture seen.  No  dislocation seen.  PATIENT SURVEYS:  PSFS: THE PATIENT SPECIFIC FUNCTIONAL SCALE  Place score of 0-10 (0 = unable to perform activity and 10 = able to perform activity at the same level as before injury or problem)  Activity Date: 12/13/2024    Walk 5    2.  Sleep through the night 5    3.     4.      Total Score 5      Total Score = Sum of activity scores/number of activities  Minimally Detectable Change: 3 points (for single activity); 2 points (for average score)  [facility] (nd). The Patient Specific Functional Scale . Retrieved from [email or web address]   COGNITION: Overall cognitive status: Within functional limits for tasks assessed     SENSATION: Eileen Levy had no complaints of new peripheral pain or paresthesias  EDEMA:  Noted and not objectively assessed   LOWER EXTREMITY ROM:  Active ROM Left/Right    Hip flexion    Hip extension    Hip abduction    Hip adduction    Hip internal rotation  Hip external rotation    Knee flexion    Knee extension    Ankle dorsiflexion 11/10   Ankle plantarflexion    Ankle inversion    Ankle eversion     (Blank rows = not tested)  LOWER EXTREMITY STRENGTH:  In pounds with a hand-held dynamometer Left/Right 12/13/2024   Hip flexion    Hip extension    Hip abduction    Hip adduction    Hip internal rotation    Hip external rotation    Knee flexion    Knee extension    Ankle dorsiflexion    Ankle plantarflexion    Ankle  inversion 40.5/13.2   Ankle eversion 17.1/19.2    (Blank rows = not tested)  GAIT: Distance walked: 100 feet Assistive device utilized: 1 crutch Level of assistance: Modified independence Comments: Eileen Levy had several falls while getting used to the crutches initially                                                                                                                                TREATMENT DATE: R calcaneus Fx 12/25/24 TherAct:  Nustep level 6 for 7 minutes  Calf raises into toe raises 1x20 with bilat UE support for stability  Tap downs with 4 step and slight UE support 2x10 each side  Lateral tap down with 4 step and UE support 2x10 each side    Neuro Re-Ed:  Tandem stance 4x30s each leg back  Lateral walks on foam with stepping off/on in order to challenge transition to different surfaces 10x down and back 8x Tandem walks on foam 6x TB refresh blue 15x each direction  12/21/2024 TherAct:  Nustep level 4 for 8 minutes  Calf raises into toe raises 1x20 with bilat UE support for stability  Tap downs with 4 step and slight UE support 2x10 each side  Lateral tap down with 4 step and UE support 2x10 each side    Neuro Re-Ed:  Ankle circles performed standing with circle rocker board 1x10 CCW, CW  Fwd/back on rocker board for motor control 1x20   Semi tandem stance 1x30s each leg back  Tandem stance 1x30s each leg back  Square taps standing on black mat, 1x6 squares each  Lateral walks on foam with stepping off/on in order to challenge transition to different surfaces 10x down and back ; one instance of large posterior LOB with patient able to use UEs on // bars to self-correct   12/17/24 Ankle TB 2x10 all directions  SLR/SR 3x10 each 1.5# Squats 3x10 Review of ambulation with cane Access Code: [email or web address] URL: [email or web address] Date: 12/17/2024 Prepared by: Eileen Levy  Exercises - Heel Raise  - 5-10 x daily - 7 x weekly - 1 sets - 5-10 reps  - 3 seconds hold - Ankle Inversion with Resistance  - 2 x daily - 7 x weekly - 2 sets - 10 reps - 3 second hold - Ankle Eversion  with Resistance  - 2 x daily - 7 x weekly - 2 sets - 10 reps - 3 second hold - Long Sitting Ankle Plantar Flexion with Resistance  - 2 x daily - 7 x weekly - 2 sets - 10 reps - Long Sitting Ankle Dorsiflexion with Anchored Resistance  - 2 x daily - 7 x weekly - 2 sets - 10 reps  12/13/2024 Standing heel raises, shift weight to the right side and slow eccentrics 2 sets of 10 for 3 seconds Ankle Thera-Band Inversion Green 10 x slow eccentrics Ankle Thera-Band Eversion Green 10 x slow eccentrics  97535: Discussed exam findings; day 1 HEP and answered questions   PATIENT EDUCATION:  Education details: See above Person educated: Patient Education method: Explanation, Demonstration, Tactile cues, Verbal cues, and Handouts Education comprehension: verbalized understanding, returned demonstration, verbal cues required, tactile cues required, and needs further education  HOME EXERCISE PROGRAM: Access Code: [email or web address] URL: [email or web address] Date: 12/13/2024 Prepared by: Lamar Ivory  Exercises - Heel Raise  - 5-10 x daily - 7 x weekly - 1 sets - 5-10 reps - 3 seconds hold - Ankle Inversion with Resistance  - 2 x daily - 7 x weekly - 2 sets - 10 reps - 3 second hold - Ankle Eversion with Resistance  - 2 x daily - 7 x weekly - 2 sets - 10 reps - 3 second hold  ASSESSMENT:  CLINICAL IMPRESSION: Patient needed cuing to slow pace and improve control/form of exercises.  Pt demonstrated understanding after a few sets.   OBJECTIVE IMPAIRMENTS: Abnormal gait, cardiopulmonary status limiting activity, decreased activity tolerance, decreased balance, decreased endurance, decreased knowledge of condition, difficulty walking, decreased strength, decreased safety awareness, increased edema, impaired perceived functional ability, and pain.   ACTIVITY  LIMITATIONS: carrying, standing, squatting, sleeping, stairs, bed mobility, and locomotion level  PARTICIPATION LIMITATIONS: driving, shopping, and community activity  PERSONAL FACTORS: No personal factors are affecting Chauncey's functional outcome.   REHAB POTENTIAL: Good  CLINICAL DECISION MAKING: Stable/uncomplicated  EVALUATION COMPLEXITY: Low   GOALS: Goals reviewed with patient? Yes  SHORT TERM GOALS: Target date: 01/10/2025  Marzetta will be independent with her day 1 home exercise program Baseline: Started 12/13/2024 Goal status: MET 12/17/24  2.  Improve bilateral ankle strength for inversion and eversion to at least 35 pounds Baseline: Ankle inversion 40.5/13.2 pounds and ankle eversion 17.1/19.2 pounds Goal status: INITIAL  3.  Maeleigh will need her cane less than 50% of the time with walking Baseline: 100% of the time outside the house, only goes without it in the bathroom and kitchen Goal status: INITIAL   LONG TERM GOALS: Target date: 02/07/2025  Improve patient-specific functional score to at least 7 Baseline: 5 Goal status: INITIAL  2.  Delena will report right heel pain consistently 0-2/10 and it will not wake her up at night Baseline: 3/10 at night and it affects sleep Goal status: INITIAL  3.  Camreigh will be comfortable without her cane at 100% of the time Baseline: See above Goal status: INITIAL  4.  Improve bilateral ankle strength for inversion and eversion to at least 40 pounds Baseline: See short-term goal Goal status: INITIAL  5.  Jamonica will be independent with her long-term maintenance home exercise program at discharge Baseline: Started 12/13/2024 Goal status: INITIAL  PLAN:  PT FREQUENCY: 2x/week  PT DURATION: 8 weeks  PLANNED INTERVENTIONS: 97750- Physical Performance Testing, 97110-Therapeutic exercises, 97530- Therapeutic activity, [id number]- Neuromuscular re-education, 97535- Self Care, 02883- Gait training, [phone]- Vasopneumatic  device,  Patient/Family education, Balance training, Stair training, and Cryotherapy  PLAN FOR NEXT SESSION:  Continue with emphasis on lower extremity and ankle strength, balance and functional progressions to prepare her for a trip to [place] in late February of this year.  Eileen Levy, PT 12/25/24  2:38 PM    "

## 2024-12-25 ENCOUNTER — Ambulatory Visit

## 2024-12-25 DIAGNOSIS — M6281 Muscle weakness (generalized): Secondary | ICD-10-CM

## 2024-12-25 DIAGNOSIS — R600 Localized edema: Secondary | ICD-10-CM

## 2024-12-25 DIAGNOSIS — R262 Difficulty in walking, not elsewhere classified: Secondary | ICD-10-CM | POA: Diagnosis not present

## 2024-12-26 ENCOUNTER — Encounter

## 2024-12-27 ENCOUNTER — Ambulatory Visit: Admitting: Rehabilitative and Restorative Service Providers"

## 2024-12-27 ENCOUNTER — Encounter: Payer: Self-pay | Admitting: Rehabilitative and Restorative Service Providers"

## 2024-12-27 DIAGNOSIS — R600 Localized edema: Secondary | ICD-10-CM

## 2024-12-27 DIAGNOSIS — R262 Difficulty in walking, not elsewhere classified: Secondary | ICD-10-CM | POA: Diagnosis not present

## 2024-12-27 DIAGNOSIS — M6281 Muscle weakness (generalized): Secondary | ICD-10-CM | POA: Diagnosis not present

## 2024-12-27 NOTE — Therapy (Signed)
 " OUTPATIENT PHYSICAL THERAPY LOWER EXTREMITY TREATMENT   Patient Name: Eileen Levy MRN: [id number] DOB:04/17/1946, 79 y.o., female Today's Date: 12/27/2024  END OF SESSION:  PT End of Session - 12/27/24 1014     Visit Number 5    Number of Visits 16    Date for Recertification  02/07/25    Authorization Type [employer] MEDICARE    PT Start Time 1014    PT Stop Time 1057    PT Time Calculation (min) 43 min    Activity Tolerance Patient tolerated treatment well;No increased pain    Behavior During Therapy [facility] for tasks assessed/performed              History reviewed. No pertinent past medical history. History reviewed. No pertinent surgical history. There are no active problems to display for this patient.   PCP: Eileen MATSU. Yolande, MD  REFERRING PROVIDER: Ozell delena Ada MD  REFERRING DIAG: [phone] (ICD-10-CM) - Closed displaced fracture of body of right calcaneus, initial encounter M76.71 (ICD-10-CM) - Peroneal tendinitis, right leg  THERAPY DIAG:  Difficulty in walking, not elsewhere classified  Muscle weakness (generalized)  Localized edema  Rationale for Evaluation and Treatment: Rehabilitation  ONSET DATE: August 14, 2024  SUBJECTIVE:   SUBJECTIVE STATEMENT: Eileen Levy can now stand for an hour.  Her normal walking is also not limited.    PERTINENT HISTORY: NA  PAIN:  Are you having pain? Yes: NPRS scale: 2-4/10 this week Pain location: Tendon on the bone on the outside of the ankle Pain description: Old ache Aggravating factors: When it gets hit at night it is doing better, stiffness in the morning Relieving factors: Don't hit it, move the foot  PRECAUTIONS: None  RED FLAGS: None   WEIGHT BEARING RESTRICTIONS: No  FALLS:  Has patient fallen in last 6 months? Yes. Number of falls several falls due to getting used to the crutches  LIVING ENVIRONMENT: Lives with: lives alone Lives in: House/apartment Stairs: Does OK, but uses the  crutch Has following equipment at home: Crutches, 1 crutch  OCCUPATION: Retired  PLOF: Independent  PATIENT GOALS: Be ready for a trip to [place] at the end of February  NEXT MD VISIT: 01/21/2025  OBJECTIVE:  Note: Objective measures were completed at Evaluation unless otherwise noted.  DIAGNOSTIC FINDINGS: XRs of the right calcaneus from 12/10/2024 were independently reviewed and  interpreted, showing no significant change in alignment in the calcaneus  fracture from prior films on 10/25/2024.  No new fracture seen.  No  dislocation seen.  PATIENT SURVEYS:  PSFS: THE PATIENT SPECIFIC FUNCTIONAL SCALE  Place score of 0-10 (0 = unable to perform activity and 10 = able to perform activity at the same level as before injury or problem)  Activity Date: 12/13/2024    Walk 5    2.  Sleep through the night 5    3.     4.      Total Score 5      Total Score = Sum of activity scores/number of activities  Minimally Detectable Change: 3 points (for single activity); 2 points (for average score)  [name] (nd). The Patient Specific Functional Scale . Retrieved from [email or web address].[email or web address]   COGNITION: Overall cognitive status: Within functional limits for tasks assessed     SENSATION: [name] had no complaints of new peripheral pain or paresthesias  EDEMA:  Noted and not objectively assessed   LOWER EXTREMITY ROM:  Active ROM Left/Right    Hip flexion  Hip extension    Hip abduction    Hip adduction    Hip internal rotation    Hip external rotation    Knee flexion    Knee extension    Ankle dorsiflexion 11/10   Ankle plantarflexion    Ankle inversion    Ankle eversion     (Blank rows = not tested)  LOWER EXTREMITY STRENGTH:  In pounds with a hand-held dynamometer Left/Right 12/13/2024   Hip flexion    Hip extension    Hip abduction    Hip adduction    Hip internal rotation    Hip external rotation     Knee flexion    Knee extension    Ankle dorsiflexion    Ankle plantarflexion    Ankle inversion 40.5/13.2   Ankle eversion 17.1/19.2    (Blank rows = not tested)  GAIT: Distance walked: 100 feet Assistive device utilized: 1 crutch Level of assistance: Modified independence Comments: [name] had several falls while getting used to the crutches initially                                                                                                                     TREATMENT DATE: R calcaneus Fx 12/27/2024 Standing heel raises, shift weight to the right side and slow eccentrics 20 for 3 seconds Ankle Thera-Band Inversion Black 20 x slow eccentrics Ankle Thera-Band Eversion Black 20 x slow eccentrics Heel cord slant board with slight toe in knees straight and bent 1 x each 60 seconds (skip next time)  Neuromuscular re-education: Tandem balance: Eyes open; head turning and eyes closed 4 x 20 seconds each Single-leg balance eyes open 8 x 10 seconds  Functional Activities: Step-down off 4 and 6 inch step 10 x each  97535: Reviewed and updated HEP and answered questions   12/25/24 TherAct:  Nustep level 6 for 7 minutes  Calf raises into toe raises 1x20 with bilat UE support for stability  Tap downs with 4 step and slight UE support 2x10 each side  Lateral tap down with 4 step and UE support 2x10 each side    Neuro Re-Ed:  Tandem stance 4x30s each leg back  Lateral walks on foam with stepping off/on in order to challenge transition to different surfaces 10x down and back 8x Tandem walks on foam 6x TB refresh blue 15x each direction   12/21/2024 TherAct:  Nustep level 4 for 8 minutes  Calf raises into toe raises 1x20 with bilat UE support for stability  Tap downs with 4 step and slight UE support 2x10 each side  Lateral tap down with 4 step and UE support 2x10 each side    Neuro Re-Ed:  Ankle circles performed standing with circle rocker board 1x10 CCW, CW   Fwd/back on rocker board for motor control 1x20   Semi tandem stance 1x30s each leg back  Tandem stance 1x30s each leg back  Square taps standing on black mat, 1x6 squares each  Lateral walks on foam with stepping  off/on in order to challenge transition to different surfaces 10x down and back ; one instance of large posterior LOB with patient able to use UEs on // bars to self-correct    PATIENT EDUCATION:  Education details: See above Person educated: Patient Education method: Explanation, Demonstration, Tactile cues, Verbal cues, and Handouts Education comprehension: verbalized understanding, returned demonstration, verbal cues required, tactile cues required, and needs further education  HOME EXERCISE PROGRAM: Access Code: [email or web address] URL: [email or web address] Date: 12/27/2024 Prepared by: Lamar Ivory  Exercises - Heel Raise  - 5-10 x daily - 7 x weekly - 1 sets - 5-10 reps - 3 seconds hold - Ankle Inversion with Resistance  - 2 x daily - 7 x weekly - 2 sets - 10 reps - 3 second hold - Ankle Eversion with Resistance  - 2 x daily - 7 x weekly - 2 sets - 10 reps - 3 second hold - Long Sitting Ankle Plantar Flexion with Resistance  - 2 x daily - 7 x weekly - 2 sets - 10 reps - Long Sitting Ankle Dorsiflexion with Anchored Resistance  - 2 x daily - 7 x weekly - 2 sets - 10 reps - Tandem Stance  - 1 x daily - 7 x weekly - 1 sets - 10 reps - 20 second hold - Single Leg Stance  - 1 x daily - 7 x weekly - 1 sets - 10 reps - 10 seconds hold  ASSESSMENT:  CLINICAL IMPRESSION: Avory reports progress with her pain and endurance since starting supervised PT.  We were able to make some strength, function and balance progressions today to help her meet long-term goals and prepare her for her upcoming [place] trip.   OBJECTIVE IMPAIRMENTS: Abnormal gait, cardiopulmonary status limiting activity, decreased activity tolerance, decreased balance, decreased endurance, decreased  knowledge of condition, difficulty walking, decreased strength, decreased safety awareness, increased edema, impaired perceived functional ability, and pain.   ACTIVITY LIMITATIONS: carrying, standing, squatting, sleeping, stairs, bed mobility, and locomotion level  PARTICIPATION LIMITATIONS: driving, shopping, and community activity  PERSONAL FACTORS: No personal factors are affecting Tahirah's functional outcome.   REHAB POTENTIAL: Good  CLINICAL DECISION MAKING: Stable/uncomplicated  EVALUATION COMPLEXITY: Low   GOALS: Goals reviewed with patient? Yes  SHORT TERM GOALS: Target date: 01/10/2025  Duyen will be independent with her day 1 home exercise program Baseline: Started 12/13/2024 Goal status: MET 12/17/24  2.  Improve bilateral ankle strength for inversion and eversion to at least 35 pounds Baseline: Ankle inversion 40.5/13.2 pounds and ankle eversion 17.1/19.2 pounds Goal status: INITIAL  3.  Halea will need her cane less than 50% of the time with walking Baseline: 100% of the time outside the house, only goes without it in the bathroom and kitchen Goal status: Met 12/27/2024   LONG TERM GOALS: Target date: 02/07/2025  Improve patient-specific functional score to at least 7 Baseline: 5 Goal status: INITIAL  2.  Addis will report right heel pain consistently 0-2/10 and it will not wake her up at night Baseline: 3/10 at night and it affects sleep Goal status: Ongoing 12/27/2024  3.  Shelina will be comfortable without her cane at 100% of the time Baseline: See above Goal status: INITIAL  4.  Improve bilateral ankle strength for inversion and eversion to at least 40 pounds Baseline: See short-term goal Goal status: INITIAL  5.  Genever will be independent with her long-term maintenance home exercise program at discharge Baseline: Started 12/13/2024 Goal status: INITIAL  PLAN:  PT  FREQUENCY: 2x/week  PT DURATION: 8 weeks  PLANNED INTERVENTIONS: 97750- Physical  Performance Testing, 97110-Therapeutic exercises, 97530- Therapeutic activity, 97112- Neuromuscular re-education, [phone]- Self Care, 02883- Gait training, [phone]- Vasopneumatic device, Patient/Family education, Balance training, Stair training, and Cryotherapy  PLAN FOR NEXT SESSION:  Continue with emphasis on lower extremity and ankle strength, balance and functional progressions to prepare her for a trip to [place] in late February of this year.  Rob will reassess strength soon.  Myer MICAEL Ivory PT, MPT 12/27/24  1:42 PM    "

## 2025-01-02 ENCOUNTER — Encounter

## 2025-01-04 ENCOUNTER — Encounter: Payer: Self-pay | Admitting: Rehabilitative and Restorative Service Providers"

## 2025-01-04 ENCOUNTER — Ambulatory Visit: Admitting: Rehabilitative and Restorative Service Providers"

## 2025-01-04 DIAGNOSIS — R600 Localized edema: Secondary | ICD-10-CM

## 2025-01-04 DIAGNOSIS — M6281 Muscle weakness (generalized): Secondary | ICD-10-CM | POA: Diagnosis not present

## 2025-01-04 DIAGNOSIS — R262 Difficulty in walking, not elsewhere classified: Secondary | ICD-10-CM

## 2025-01-04 NOTE — Therapy (Signed)
 " OUTPATIENT PHYSICAL THERAPY LOWER EXTREMITY TREATMENT   Patient Name: Eileen Levy MRN: [id number] DOB:[birth year], 79 y.o., female Today's Date: 01/04/2025  END OF SESSION:  PT End of Session - 01/04/25 0930     Visit Number 6    Number of Visits 16    Date for Recertification  02/07/25    Authorization Type [employer] MEDICARE    PT Start Time 0930    PT Stop Time 1019    PT Time Calculation (min) 49 min    Activity Tolerance Patient tolerated treatment well;No increased pain    Behavior During Therapy [facility] for tasks assessed/performed             History reviewed. No pertinent past medical history. History reviewed. No pertinent surgical history. There are no active problems to display for this patient.   PCP: Toribio MATSU. Yolande, MD  REFERRING PROVIDER: Ozell delena Ada MD  REFERRING DIAG: [phone] (ICD-10-CM) - Closed displaced fracture of body of right calcaneus, initial encounter M76.71 (ICD-10-CM) - Peroneal tendinitis, right leg  THERAPY DIAG:  Difficulty in walking, not elsewhere classified  Muscle weakness (generalized)  Localized edema  Rationale for Evaluation and Treatment: Rehabilitation  ONSET DATE: August 14, 2024  SUBJECTIVE:   SUBJECTIVE STATEMENT: Eileen Levy is now walking up to 2 miles with her home walking program.  She reports progress, although some of her lateral and anterior ankle discomfort is present at the end of the day when she has been very active.  PERTINENT HISTORY: NA  PAIN:  Are you having pain? Yes: NPRS scale: 0-4/10 this week Pain location: Lateral and anterior ankle Pain description: ache Aggravating factors: Late in the day and after a lot of activity Relieving factors: Rest and exercise  PRECAUTIONS: None  RED FLAGS: None   WEIGHT BEARING RESTRICTIONS: No  FALLS:  Has patient fallen in last 6 months? Yes. Number of falls several falls due to getting used to the crutches  LIVING ENVIRONMENT: Lives with:  lives alone Lives in: House/apartment Stairs: Does OK, but uses the crutch Has following equipment at home: Crutches, 1 crutch  OCCUPATION: Retired  PLOF: Independent  PATIENT GOALS: Be ready for a trip to [place] at the end of February  NEXT MD VISIT: 01/21/2025  OBJECTIVE:  Note: Objective measures were completed at Evaluation unless otherwise noted.  DIAGNOSTIC FINDINGS: XRs of the right calcaneus from 12/10/2024 were independently reviewed and  interpreted, showing no significant change in alignment in the calcaneus  fracture from prior films on 10/25/2024.  No new fracture seen.  No  dislocation seen.  PATIENT SURVEYS:  PSFS: THE PATIENT SPECIFIC FUNCTIONAL SCALE  Place score of 0-10 (0 = unable to perform activity and 10 = able to perform activity at the same level as before injury or problem)  Activity Date: 12/13/2024    Walk 5    2.  Sleep through the night 5    3.     4.      Total Score 5      Total Score = Sum of activity scores/number of activities  Minimally Detectable Change: 3 points (for single activity); 2 points (for average score)  [name] (nd). The Patient Specific Functional Scale . Retrieved from [email or web address]   COGNITION: Overall cognitive status: Within functional limits for tasks assessed     SENSATION: Alle had no complaints of new peripheral pain or paresthesias  EDEMA:  Noted and not objectively assessed   LOWER EXTREMITY ROM:  Active  ROM Left/Right  Left/Right 01/04/29/2026  Hip flexion    Hip extension    Hip abduction    Hip adduction    Hip internal rotation    Hip external rotation    Knee flexion    Knee extension    Ankle dorsiflexion 11/10 10/12  Ankle plantarflexion    Ankle inversion    Ankle eversion     (Blank rows = not tested)  LOWER EXTREMITY STRENGTH:  In pounds with a hand-held dynamometer Left/Right 12/13/2024 Left/Right 01/04/2025  Hip  flexion    Hip extension    Hip abduction    Hip adduction    Hip internal rotation    Hip external rotation    Knee flexion    Knee extension  59.0/43.0  Ankle dorsiflexion    Ankle plantarflexion    Ankle inversion 40.5/13.2 40.5/21.7  Ankle eversion 17.1/19.2 25.3/28.9   (Blank rows = not tested)  GAIT: Distance walked: 100 feet Assistive device utilized: 1 crutch Level of assistance: Modified independence Comments: Glenda had several falls while getting used to the crutches initially                                                                                                                     TREATMENT DATE: R calcaneus Fx 01/04/2025 Standing heel raises, shift weight to the right side and slow eccentrics 20 for 3 seconds Ankle Thera-Band Inversion Black with PT and high tension 2 sets of 10 x slow eccentrics (2nd set sitting) Ankle Thera-Band Eversion Black with PT and high tension 2 sets of 10 x slow eccentrics Heel cord slant board with slight toe in knees straight and bent 1 x each 60 seconds (skip next time)  Neuromuscular re-education: Tandem balance: Eyes open; head turning and eyes closed 4 x 20 seconds each Single-leg balance eyes open 8 x 10 seconds  Functional Activities: Step-down off 4 inch step 2 sets of 10 x each  97535: Reviewed and updated HEP    12/27/2024 Standing heel raises, shift weight to the right side and slow eccentrics 20 for 3 seconds Ankle Thera-Band Inversion Black 20 x slow eccentrics Ankle Thera-Band Eversion Black 20 x slow eccentrics Heel cord slant board with slight toe in knees straight and bent 1 x each 60 seconds (skip next time)  Neuromuscular re-education: Tandem balance: Eyes open; head turning and eyes closed 4 x 20 seconds each Single-leg balance eyes open 8 x 10 seconds  Functional Activities: Step-down off 4 and 6 inch step 10 x each  97535: Reviewed and updated HEP and answered questions   12/25/24 TherAct:   Nustep level 6 for 7 minutes  Calf raises into toe raises 1x20 with bilat UE support for stability  Tap downs with 4 step and slight UE support 2x10 each side  Lateral tap down with 4 step and UE support 2x10 each side    Neuro Re-Ed:  Tandem stance 4x30s each leg back  Lateral walks on foam with stepping off/on in  order to challenge transition to different surfaces 10x down and back 8x Tandem walks on foam 6x TB refresh blue 15x each direction   PATIENT EDUCATION:  Education details: See above Person educated: Patient Education method: Explanation, Demonstration, Tactile cues, Verbal cues, and Handouts Education comprehension: verbalized understanding, returned demonstration, verbal cues required, tactile cues required, and needs further education  HOME EXERCISE PROGRAM: Access Code: [email or web address] URL: [email or web address] Date: 01/04/2025 Prepared by: Lamar Ivory  Exercises - Heel Raise  - 5-10 x daily - 7 x weekly - 1 sets - 5-10 reps - 3 seconds hold - Ankle Inversion with Resistance  - 2 x daily - 7 x weekly - 2 sets - 10 reps - 3 second hold - Ankle Eversion with Resistance  - 2 x daily - 7 x weekly - 2 sets - 10 reps - 3 second hold - Long Sitting Ankle Plantar Flexion with Resistance  - 2 x daily - 7 x weekly - 2 sets - 10 reps - Long Sitting Ankle Dorsiflexion with Anchored Resistance  - 2 x daily - 7 x weekly - 2 sets - 10 reps - Tandem Stance  - 1 x daily - 7 x weekly - 1 sets - 10 reps - 20 second hold - Single Leg Stance  - 1 x daily - 7 x weekly - 1 sets - 10 reps - 10 seconds hold - Lateral Step Down  - 1 x daily - 3-4 x weekly - 2-3 sets - 10 reps  ASSESSMENT:  CLINICAL IMPRESSION: Nyaja is making objective progress with her dorsiflexion active range of motion and ankle strength.  She is now walking up to 2 miles with her home exercises, but she reports anterior and lateral ankle soreness after a lot of activity.  This is consistent with remaining  strength deficits.  She remains on track to meet long-term goals of before her trip to [place] at the end of February.  OBJECTIVE IMPAIRMENTS: Abnormal gait, cardiopulmonary status limiting activity, decreased activity tolerance, decreased balance, decreased endurance, decreased knowledge of condition, difficulty walking, decreased strength, decreased safety awareness, increased edema, impaired perceived functional ability, and pain.   ACTIVITY LIMITATIONS: carrying, standing, squatting, sleeping, stairs, bed mobility, and locomotion level  PARTICIPATION LIMITATIONS: driving, shopping, and community activity  PERSONAL FACTORS: No personal factors are affecting Falecia's functional outcome.   REHAB POTENTIAL: Good  CLINICAL DECISION MAKING: Stable/uncomplicated  EVALUATION COMPLEXITY: Low   GOALS: Goals reviewed with patient? Yes  SHORT TERM GOALS: Target date: 01/10/2025  Kihanna will be independent with her day 1 home exercise program Baseline: Started 12/13/2024 Goal status: MET 12/17/24  2.  Improve bilateral ankle strength for inversion and eversion to at least 35 pounds Baseline: Ankle inversion 40.5/13.2 pounds and ankle eversion 17.1/19.2 pounds Goal status: Ongoing 01/04/2025  3.  Andreah will need her cane less than 50% of the time with walking Baseline: 100% of the time outside the house, only goes without it in the bathroom and kitchen Goal status: Met 12/27/2024   LONG TERM GOALS: Target date: 02/07/2025  Improve patient-specific functional score to at least 7 Baseline: 5 Goal status: INITIAL  2.  Aerianna will report right heel pain consistently 0-2/10 and it will not wake her up at night Baseline: 3/10 at night and it affects sleep Goal status: Ongoing 12/08/2024  3.  Zerline will be comfortable without her cane at 100% of the time Baseline: See above Goal status: Partially met 01/04/2025  4.  Improve bilateral ankle strength  for inversion and eversion to at least 40  pounds Baseline: See short-term goal Goal status: Ongoing 01/04/2025  5.  Madolyn will be independent with her long-term maintenance home exercise program at discharge Baseline: Started 12/13/2024 Goal status: INITIAL  PLAN:  PT FREQUENCY: 2x/week  PT DURATION: 8 weeks  PLANNED INTERVENTIONS: 97750- Physical Performance Testing, 97110-Therapeutic exercises, 97530- Therapeutic activity, 97112- Neuromuscular re-education, [phone]- Self Care, 02883- Gait training, [phone]- Vasopneumatic device, Patient/Family education, Balance training, Stair training, and Cryotherapy  PLAN FOR NEXT SESSION: Emphasis remains lower extremity and ankle strength, balance and functional progressions to prepare her for a trip to [place] in late February of this year.    Myer MICAEL Ivory PT, MPT 01/04/25  11:35 AM    "

## 2025-01-07 ENCOUNTER — Ambulatory Visit

## 2025-01-08 ENCOUNTER — Ambulatory Visit

## 2025-01-08 DIAGNOSIS — M6281 Muscle weakness (generalized): Secondary | ICD-10-CM | POA: Diagnosis not present

## 2025-01-08 DIAGNOSIS — R600 Localized edema: Secondary | ICD-10-CM | POA: Diagnosis not present

## 2025-01-08 DIAGNOSIS — R262 Difficulty in walking, not elsewhere classified: Secondary | ICD-10-CM

## 2025-01-10 ENCOUNTER — Encounter: Payer: Self-pay | Admitting: Rehabilitative and Restorative Service Providers"

## 2025-01-10 ENCOUNTER — Ambulatory Visit: Admitting: Rehabilitative and Restorative Service Providers"

## 2025-01-10 DIAGNOSIS — R600 Localized edema: Secondary | ICD-10-CM

## 2025-01-10 DIAGNOSIS — M6281 Muscle weakness (generalized): Secondary | ICD-10-CM

## 2025-01-10 DIAGNOSIS — R262 Difficulty in walking, not elsewhere classified: Secondary | ICD-10-CM

## 2025-01-10 NOTE — Therapy (Signed)
 " OUTPATIENT PHYSICAL THERAPY LOWER EXTREMITY TREATMENT   Patient Name: Eileen Levy MRN: [id number] DOB:1946/10/28, 79 y.o., female Today's Date: 01/10/2025  END OF SESSION:  PT End of Session - 01/10/25 0930     Visit Number 8    Number of Visits 16    Date for Recertification  02/07/25    Authorization Type [employer] MEDICARE    PT Start Time 0929    PT Stop Time 1016    PT Time Calculation (min) 47 min    Activity Tolerance Patient tolerated treatment well;No increased pain;Patient limited by pain    Behavior During Therapy [facility] for tasks assessed/performed               History reviewed. No pertinent past medical history. History reviewed. No pertinent surgical history. There are no active problems to display for this patient.   PCP: Toribio MATSU. Yolande, MD  REFERRING PROVIDER: Ozell delena Ada MD  REFERRING DIAG: [phone] (ICD-10-CM) - Closed displaced fracture of body of right calcaneus, initial encounter M76.71 (ICD-10-CM) - Peroneal tendinitis, right leg  THERAPY DIAG:  Difficulty in walking, not elsewhere classified  Muscle weakness (generalized)  Localized edema  Rationale for Evaluation and Treatment: Rehabilitation  ONSET DATE: August 14, 2024  SUBJECTIVE:   SUBJECTIVE STATEMENT: Eileen Levy continues to do a good job with her HEP.  She notes some right knee swelling that gets exacerbated with single-leg activities (balance, stairs) so we'll hold off these today.  PERTINENT HISTORY: NA  PAIN:  Are you having pain? Yes: NPRS scale:  1-3/10 this week Pain location: Lateral and anterior ankle Pain description: ache Aggravating factors: Late in the day and after a lot of activity or single-leg activities Relieving factors: Rest and exercise  PRECAUTIONS: None  RED FLAGS: None   WEIGHT BEARING RESTRICTIONS: No  FALLS:  Has patient fallen in last 6 months? Yes. Number of falls several falls due to getting used to the crutches  LIVING  ENVIRONMENT: Lives with: lives alone Lives in: House/apartment Stairs: Does OK, but uses the crutch Has following equipment at home: Crutches, 1 crutch  OCCUPATION: Retired  PLOF: Independent  PATIENT GOALS: Be ready for a trip to [place] at the end of February  NEXT MD VISIT: 01/21/2025  OBJECTIVE:  Note: Objective measures were completed at Evaluation unless otherwise noted.  DIAGNOSTIC FINDINGS: XRs of the right calcaneus from 12/10/2024 were independently reviewed and  interpreted, showing no significant change in alignment in the calcaneus  fracture from prior films on 10/25/2024.  No new fracture seen.  No  dislocation seen.  PATIENT SURVEYS:  PSFS: THE PATIENT SPECIFIC FUNCTIONAL SCALE  Place score of 0-10 (0 = unable to perform activity and 10 = able to perform activity at the same level as before injury or problem)  Activity Date: 12/13/2024    Walk 5    2.  Sleep through the night 5    3.     4.      Total Score 5      Total Score = Sum of activity scores/number of activities  Minimally Detectable Change: 3 points (for single activity); 2 points (for average score)  [facility] (nd). The Patient Specific Functional Scale . Retrieved from [email or web address]   COGNITION: Overall cognitive status: Within functional limits for tasks assessed     SENSATION: Eileen Levy had no complaints of new peripheral pain or paresthesias  EDEMA:  Noted and not objectively assessed   LOWER EXTREMITY ROM:  Active  ROM Left/Right  Left/Right 01/04/29/2026  Hip flexion    Hip extension    Hip abduction    Hip adduction    Hip internal rotation    Hip external rotation    Knee flexion    Knee extension    Ankle dorsiflexion 11/10 10/12  Ankle plantarflexion    Ankle inversion    Ankle eversion     (Blank rows = not tested)  LOWER EXTREMITY STRENGTH:  In pounds with a hand-held dynamometer Left/Right 12/13/2024  Left/Right 01/04/2025  Hip flexion    Hip extension    Hip abduction    Hip adduction    Hip internal rotation    Hip external rotation    Knee flexion    Knee extension  59.0/43.0  Ankle dorsiflexion    Ankle plantarflexion    Ankle inversion 40.5/13.2 40.5/21.7  Ankle eversion 17.1/19.2 25.3/28.9   (Blank rows = not tested)  GAIT: Distance walked: 100 feet Assistive device utilized: 1 crutch Level of assistance: Modified independence Comments: Eileen Levy had several falls while getting used to the crutches initially                                                                                                                     TREATMENT DATE: R calcaneus Fx 01/10/2025 Upper and lower heel cords box 1 minute each (while we discussed compression hose) Standing heel raises, shift weight to the right side and slow eccentrics 20 for 3 seconds Standing heel raises up bilaterally, shift weight to the right side and (on the right side only) slow eccentrics 6 for 3 seconds Ankle Thera-Band Inversion Black with PT and high tension 2 sets of 10 x slow eccentrics (2nd set sitting) Ankle Thera-Band Eversion Black with PT and high tension 2 sets of 10 x slow eccentrics Seated straight leg raises 2 sets of 10 with 2#  Neuromuscular re-education: Tandem balance: Eyes open; head turning; eyes closed and on foam 4 x 20 seconds each Walking on foam heel to toe 4 laps  97535: Reviewed and updated HEP    01/08/25 TherAct:  Nustep level 6 for 7 minutes  UUDD R leading 6 step forward and sideways 10x Step offs leading with L 6 step 20x Standing heel raises, shift weight to the right side and slow eccentrics 20 for 3 seconds TB black 3x10 inv and eversion Neuro Re-Ed:  Tandem 4x 25 sec Tandem 4x 25 sec on Air ex  Tandem and side stepping on long Airex 6 laps each   01/04/2025 Standing heel raises, shift weight to the right side and slow eccentrics 20 for 3 seconds Ankle Thera-Band  Inversion Black with PT and high tension 2 sets of 10 x slow eccentrics (2nd set sitting) Ankle Thera-Band Eversion Black with PT and high tension 2 sets of 10 x slow eccentrics Heel cord slant board with slight toe in knees straight and bent 1 x each 60 seconds (skip next time)  Neuromuscular re-education: Tandem balance: Eyes open; head  turning and eyes closed 4 x 20 seconds each Single-leg balance eyes open 8 x 10 seconds  Functional Activities: Step-down off 4 inch step 2 sets of 10 x each  97535: Reviewed and updated HEP    PATIENT EDUCATION:  Education details: See above Person educated: Patient Education method: Explanation, Demonstration, Tactile cues, Verbal cues, and Handouts Education comprehension: verbalized understanding, returned demonstration, verbal cues required, tactile cues required, and needs further education  HOME EXERCISE PROGRAM: Access Code: [email or web address] URL: [email or web address] Date: 01/10/2025 Prepared by: Lamar Ivory  Exercises - Heel Raise  - 5-10 x daily - 7 x weekly - 1 sets - 5-10 reps - 3 seconds hold - Ankle Inversion with Resistance  - 2 x daily - 7 x weekly - 2 sets - 10 reps - 3 second hold - Ankle Eversion with Resistance  - 2 x daily - 7 x weekly - 2 sets - 10 reps - 3 second hold - Long Sitting Ankle Plantar Flexion with Resistance  - 2 x daily - 7 x weekly - 2 sets - 10 reps - Long Sitting Ankle Dorsiflexion with Anchored Resistance  - 2 x daily - 7 x weekly - 2 sets - 10 reps - Tandem Stance  - 1 x daily - 7 x weekly - 1 sets - 10 reps - 20 second hold - Single Leg Stance  - 1 x daily - 7 x weekly - 1 sets - 10 reps - 10 seconds hold - Seated Straight Leg Raise   - 1 x daily - 7 x weekly - 3 sets - 10 reps - 2 seconds hold  ASSESSMENT:  CLINICAL IMPRESSION: Eileen Levy continues to do a great job with her home exercises as she is very motivated to be ready for her [place] trip at the end of February.  We discussed continuing  the compression hose, although it is OK to take them off later in the day if she feels the compression is too much.  I will complete an objective reassessment next Friday before her follow-up with Dr. Georgina 01/21/2025.  OBJECTIVE IMPAIRMENTS: Abnormal gait, cardiopulmonary status limiting activity, decreased activity tolerance, decreased balance, decreased endurance, decreased knowledge of condition, difficulty walking, decreased strength, decreased safety awareness, increased edema, impaired perceived functional ability, and pain.   ACTIVITY LIMITATIONS: carrying, standing, squatting, sleeping, stairs, bed mobility, and locomotion level  PARTICIPATION LIMITATIONS: driving, shopping, and community activity  PERSONAL FACTORS: No personal factors are affecting Eileen Levy's functional outcome.   REHAB POTENTIAL: Good  CLINICAL DECISION MAKING: Stable/uncomplicated  EVALUATION COMPLEXITY: Low   GOALS: Goals reviewed with patient? Yes  SHORT TERM GOALS: Target date: 01/10/2025  Eileen Levy will be independent with her day 1 home exercise program Baseline: Started 12/13/2024 Goal status: MET 12/17/24  2.  Improve bilateral ankle strength for inversion and eversion to at least 35 pounds Baseline: Ankle inversion 40.5/13.2 pounds and ankle eversion 17.1/19.2 pounds Goal status: Ongoing 01/04/2025  3.  Eileen Levy will need her cane less than 50% of the time with walking Baseline: 100% of the time outside the house, only goes without it in the bathroom and kitchen Goal status: Met 12/27/2024   LONG TERM GOALS: Target date: 02/07/2025  Improve patient-specific functional score to at least 7 Baseline: 5 Goal status: INITIAL  2.  Eileen Levy will report right heel pain consistently 0-2/10 and it will not wake her up at night Baseline: 3/10 at night and it affects sleep Goal status: Ongoing 12/10/2024  3.  Eileen Levy will be comfortable  without her cane at 100% of the time Baseline: See above Goal status: Partially met  01/10/2025  4.  Improve bilateral ankle strength for inversion and eversion to at least 40 pounds Baseline: See short-term goal Goal status: Ongoing 01/04/2025  5.  Eileen Levy will be independent with her long-term maintenance home exercise program at discharge Baseline: Started 12/13/2024 Goal status: INITIAL  PLAN:  PT FREQUENCY: 2x/week  PT DURATION: 8 weeks  PLANNED INTERVENTIONS: 97750- Physical Performance Testing, 97110-Therapeutic exercises, 97530- Therapeutic activity, 97112- Neuromuscular re-education, [phone]- Self Care, [id number]- Gait training, [phone]- Vasopneumatic device, Patient/Family education, Balance training, Stair training, and Cryotherapy  PLAN FOR NEXT SESSION: Emphasis remains lower extremity and ankle strength, balance and functional progressions to prepare her for a trip to [place] in late February of this year.     Myer LELON Ivory, PT, MPT 01/10/25  10:26 AM      "

## 2025-01-15 ENCOUNTER — Encounter

## 2025-01-18 ENCOUNTER — Encounter: Admitting: Rehabilitative and Restorative Service Providers"

## 2025-01-21 ENCOUNTER — Ambulatory Visit: Admitting: Orthopedic Surgery

## 2025-01-23 ENCOUNTER — Encounter

## 2025-01-25 ENCOUNTER — Encounter

## 2025-03-04 ENCOUNTER — Ambulatory Visit
